# Patient Record
Sex: Male | Born: 1980 | Hispanic: Yes | Marital: Married | Smoking: Never smoker
Health system: Southern US, Community
[De-identification: ages and names within clinical notes are randomized; demographics above are authoritative.]

## PROBLEM LIST (undated history)

## (undated) DIAGNOSIS — M1990 Unspecified osteoarthritis, unspecified site: Secondary | ICD-10-CM

## (undated) HISTORY — PX: JOINT REPLACEMENT: SHX530

## (undated) HISTORY — PX: HERNIA REPAIR: SHX51

---

## 2013-04-20 ENCOUNTER — Emergency Department: Payer: Self-pay | Admitting: Emergency Medicine

## 2013-04-20 LAB — COMPREHENSIVE METABOLIC PANEL
ALBUMIN: 4.4 g/dL (ref 3.4–5.0)
ALK PHOS: 90 U/L
ANION GAP: 4 — AB (ref 7–16)
AST: 23 U/L (ref 15–37)
BUN: 14 mg/dL (ref 7–18)
Bilirubin,Total: 0.4 mg/dL (ref 0.2–1.0)
Calcium, Total: 8.8 mg/dL (ref 8.5–10.1)
Chloride: 104 mmol/L (ref 98–107)
Co2: 28 mmol/L (ref 21–32)
Creatinine: 0.96 mg/dL (ref 0.60–1.30)
EGFR (African American): >60
EGFR (Non-African Amer.): >60
GLUCOSE: 100 mg/dL — AB (ref 65–99)
OSMOLALITY: 273 (ref 275–301)
Potassium: 3.8 mmol/L (ref 3.5–5.1)
SGPT (ALT): 25 U/L (ref 12–78)
Sodium: 136 mmol/L (ref 136–145)
Total Protein: 8.6 g/dL — ABNORMAL HIGH (ref 6.4–8.2)

## 2013-04-20 LAB — LIPASE, BLOOD: Lipase: 94 U/L (ref 73–393)

## 2013-04-20 LAB — CBC
HCT: 44.1 % (ref 40.0–52.0)
HGB: 15.3 g/dL (ref 13.0–18.0)
MCH: 30.4 pg (ref 26.0–34.0)
MCHC: 34.7 g/dL (ref 32.0–36.0)
MCV: 88 fL (ref 80–100)
Platelet: 259 10*3/uL (ref 150–440)
RBC: 5.04 10*6/uL (ref 4.40–5.90)
RDW: 12.9 % (ref 11.5–14.5)
WBC: 6.9 10*3/uL (ref 3.8–10.6)

## 2013-04-20 LAB — URINALYSIS, COMPLETE
Bacteria: NONE SEEN
Bilirubin,UR: NEGATIVE
Blood: NEGATIVE
GLUCOSE, UR: NEGATIVE mg/dL (ref 0–75)
KETONE: NEGATIVE
Leukocyte Esterase: NEGATIVE
Nitrite: NEGATIVE
Ph: 5 (ref 4.5–8.0)
Protein: NEGATIVE
RBC,UR: NONE SEEN /HPF (ref 0–5)
SPECIFIC GRAVITY: 1.027 (ref 1.003–1.030)
SQUAMOUS EPITHELIAL: NONE SEEN
WBC UR: <1 /HPF (ref 0–5)

## 2013-04-20 LAB — RAPID INFLUENZA A&B ANTIGENS (ARMC ONLY)

## 2013-04-23 LAB — BETA STREP CULTURE(ARMC)

## 2014-10-27 ENCOUNTER — Emergency Department
Admission: EM | Admit: 2014-10-27 | Discharge: 2014-10-27 | Disposition: A | Discharge status: Home / Self Care | Payer: Self-pay | Attending: Emergency Medicine | Admitting: Emergency Medicine

## 2014-10-27 ENCOUNTER — Emergency Department: Payer: Self-pay

## 2014-10-27 ENCOUNTER — Encounter: Payer: Self-pay | Admitting: Emergency Medicine

## 2014-10-27 DIAGNOSIS — N201 Calculus of ureter: Secondary | ICD-10-CM | POA: Insufficient documentation

## 2014-10-27 LAB — CBC WITH DIFFERENTIAL/PLATELET
BASOS ABS: 0 10*3/uL (ref 0–0.1)
Basophils Relative: 1 %
EOS ABS: 0 10*3/uL (ref 0–0.7)
EOS PCT: 1 %
HCT: 47.5 % (ref 40.0–52.0)
HEMOGLOBIN: 16.2 g/dL (ref 13.0–18.0)
Lymphocytes Relative: 41 %
Lymphs Abs: 3.2 10*3/uL (ref 1.0–3.6)
MCH: 29.2 pg (ref 26.0–34.0)
MCHC: 34.2 g/dL (ref 32.0–36.0)
MCV: 85.2 fL (ref 80.0–100.0)
Monocytes Absolute: 0.5 10*3/uL (ref 0.2–1.0)
Monocytes Relative: 6 %
NEUTROS PCT: 51 %
Neutro Abs: 4.1 10*3/uL (ref 1.4–6.5)
PLATELETS: 366 10*3/uL (ref 150–440)
RBC: 5.57 MIL/uL (ref 4.40–5.90)
RDW: 13.2 % (ref 11.5–14.5)
WBC: 7.9 10*3/uL (ref 3.8–10.6)

## 2014-10-27 LAB — COMPREHENSIVE METABOLIC PANEL
ALBUMIN: 5.2 g/dL — AB (ref 3.5–5.0)
ALT: 23 U/L (ref 17–63)
AST: 28 U/L (ref 15–41)
Alkaline Phosphatase: 70 U/L (ref 38–126)
Anion gap: 11 (ref 5–15)
BUN: 16 mg/dL (ref 6–20)
CHLORIDE: 103 mmol/L (ref 101–111)
CO2: 24 mmol/L (ref 22–32)
CREATININE: 1.12 mg/dL (ref 0.61–1.24)
Calcium: 10.1 mg/dL (ref 8.9–10.3)
GFR calc Af Amer: >60 mL/min (ref >60)
GFR calc non Af Amer: >60 mL/min (ref >60)
GLUCOSE: 114 mg/dL — AB (ref 65–99)
Potassium: 3.4 mmol/L — ABNORMAL LOW (ref 3.5–5.1)
SODIUM: 138 mmol/L (ref 135–145)
Total Bilirubin: 1 mg/dL (ref 0.3–1.2)
Total Protein: 8.7 g/dL — ABNORMAL HIGH (ref 6.5–8.1)

## 2014-10-27 LAB — URINALYSIS COMPLETE WITH MICROSCOPIC (ARMC ONLY)
BACTERIA UA: NONE SEEN
BILIRUBIN URINE: NEGATIVE
Glucose, UA: NEGATIVE mg/dL
Ketones, ur: NEGATIVE mg/dL
Leukocytes, UA: NEGATIVE
Nitrite: NEGATIVE
PH: 7 (ref 5.0–8.0)
Protein, ur: NEGATIVE mg/dL
Specific Gravity, Urine: 1.018 (ref 1.005–1.030)

## 2014-10-27 LAB — LIPASE, BLOOD: Lipase: 22 U/L (ref 22–51)

## 2014-10-27 MED ORDER — TAMSULOSIN HCL 0.4 MG PO CAPS: 0.4000 mg | ORAL_CAPSULE | Freq: Every day | ORAL | Status: AC

## 2014-10-27 MED ORDER — ONDANSETRON HCL 4 MG/2ML IJ SOLN
4.0000 mg | Freq: Once | INTRAMUSCULAR | Status: AC
  Administered 2014-10-27: 4 mg via INTRAVENOUS

## 2014-10-27 MED ORDER — HYDROMORPHONE HCL 1 MG/ML IJ SOLN
1.0000 mg | Freq: Once | INTRAMUSCULAR | Status: AC
  Administered 2014-10-27: 1 mg via INTRAVENOUS

## 2014-10-27 MED ORDER — KETOROLAC TROMETHAMINE 30 MG/ML IJ SOLN
30.0000 mg | Freq: Once | INTRAMUSCULAR | Status: AC
  Administered 2014-10-27: 30 mg via INTRAVENOUS
  Filled 2014-10-27: qty 1

## 2014-10-27 MED ORDER — OXYCODONE-ACETAMINOPHEN 5-325 MG PO TABS: 1.0000 | ORAL_TABLET | Freq: Four times a day (QID) | ORAL | Status: DC | PRN

## 2014-10-27 NOTE — Discharge Instructions (Signed)
Cálculos renales °(Kidney Stones) °Los cálculos renales (urolitiasis) son masas sólidas que se forman en el interior de los riñones. El dolor intenso es causado por el movimiento de la piedra a través del riñón, uréter, vejiga y uretra (tracto urinario). Cuando la piedra se mueve, el uréter hace un espasmo alrededor de la misma. El cálculo generalmente se elimina con el pis (la orina).  °CUIDADOS EN EL HOGAR °· Debe ingerir gran cantidad de líquido para mantener la orina de tono claro o color amarillo pálido. Esto ayudará a eliminar la piedra. °· Cuele la orina con el colador que le han provisto. Noorine de otra forma que no sea a través del colador, ni siquiera una vez. Si elimina la piedra, se retendrá en el colador. Puede ser tan pequeña como un grano de sal. Llévela a su visita con el médico. Esto ayudará a que el médico le indique qué puede hacer para tratar de prevenir la ocurrencia de nuevos cálculos renales. °· Sólo tome los medicamentos que le haya indicado su médico. °· Concurra a las consultas de control con el médico, según las indicaciones. °· Hágase las radiografías según las indicaciones de su médico. °SOLICITE AYUDA SI: °Siente un dolor que empeora aún tomando analgésicos. °SOLICITE AYUDA DE INMEDIATO SI:  °· El dolor no mejora con los medicamentos recetados. °· Siente escalofríos o fiebre. °· El dolor aumenta y empeora en las siguientes 18 horas. °· Siente un nuevo dolor en el vientre (abdominal). °· Sufre mareos o se desmaya. °· Nota que no puede orinar. °ASEGÚRESE DE QUE:  °· Comprende estas instrucciones. °· Controlará su afección. °· Recibirá ayuda de inmediato si no mejora o si empeora. °Document Released: 04/18/2008 Document Revised: 09/22/2012 °ExitCare® Patient Information ©2015 [employer]. This information is not intended to replace advice given to you by your health care provider. Make sure you discuss any questions you have with your health care provider. ° °

## 2014-10-27 NOTE — ED Notes (Signed)
Reports LLQ pain onset today

## 2014-10-27 NOTE — ED Notes (Addendum)
Dr. Lenard Lance is at bedside with Spanish interpreter and myself at this time.  MD gave CT scan results of presence of kidney stone and educated patient and spouse regarding kidney stones and discharge plan.  Pt and spouse verbalize understanding.

## 2014-10-27 NOTE — ED Provider Notes (Signed)
[facility] Emergency Department Provider Note  Time seen: 11:20 AM  I have reviewed the triage vital signs and the nursing notes.   HISTORY  Chief Complaint Abdominal Pain    HPI Kyle Reynolds is a 34 y.o. male who presents to the emergency department with left flank pain. According to the patient he had a left hernia repaired in February, he also notes a recent accident at work which he suffered abdominal pain from back in June currently he has to wear an abdominal/back brace and a soft neck collar since his accident. Patient states he is on pain medication, but his pain has been tolerable until this morning he had acute onset 2 hours prior to arrival of left flank pain. Patient states 10/10 severe sharp left flank pain radiating down to the left testicle.States nausea but denies vomiting. Denies diarrhea or constipation last bowel movement was this morning which is normal. Denies black or bloody stool. Denies dysuria or hematuria. No history of kidney stones.    History reviewed. No pertinent past medical history.  There are no active problems to display for this patient.   History reviewed. No pertinent past surgical history.  No current outpatient prescriptions on file.  Allergies Review of patient's allergies indicates no known allergies.  History reviewed. No pertinent family history.  Social History Social History  Substance Use Topics  . Smoking status: Never Smoker   . Smokeless tobacco: None  . Alcohol Use: No    Review of Systems Constitutional: Negative for fever. Cardiovascular: Negative for chest pain. Respiratory: Negative for shortness of breath. Gastrointestinal: Left flank pain. Positive for nausea. Negative for vomiting, diarrhea. Genitourinary: Negative for dysuria. Negative hematuria. Musculoskeletal: Left back pain Neurological: Negative for headache 10-point ROS otherwise  negative.  ____________________________________________   PHYSICAL EXAM:  VITAL SIGNS: ED Triage Vitals  Enc Vitals Group     BP 10/27/14 1050 150/79 mmHg     Pulse Rate 10/27/14 1050 88     Resp 10/27/14 1050 26     Temp 10/27/14 1024 98.8 F (37.1 C)     Temp Source 10/27/14 1024 Oral     SpO2 10/27/14 1050 98 %     Weight --      Height 10/27/14 1024  (1.676 m)     Head Cir --      Peak Flow --      Pain Score 10/27/14 1024 10     Pain Loc --      Pain Edu? --      Excl. in GC? --    Constitutional: Alert and oriented. Mild distress due to abdominal pain. Eyes: Normal exam ENT   Mouth/Throat: Mucous membranes are moist. Cardiovascular: Normal rate, regular rhythm. No murmur Respiratory: Normal respiratory effort without tachypnea nor retractions. Breath sounds are clear and equal bilaterally. No wheezes/rales/rhonchi. Gastrointestinal: Soft, mild left lower quadrant tenderness palpation. No rebound or guarding. No distention. No CVA tenderness palpation. Normal external GU exam. No testicular tenderness palpation. No masses or swelling noted. Normal skin color. Musculoskeletal: Nontender with normal range of motion in all extremities. Neurologic:  Normal speech and language. No gross focal neurologic deficits are appreciated.  Psychiatric: Mood and affect are normal. Speech and behavior are normal. Patient exhibits appropriate insight and judgment.  ____________________________________________     RADIOLOGY  CT consistent with left-sided ureterolithiasis  ____________________________________________    INITIAL IMPRESSION / ASSESSMENT AND PLAN / ED COURSE  Pertinent labs & imaging results that  were available during my care of the patient were reviewed by me and considered in my medical decision making (see chart for details).  Patient with acute onset of left flank pain. Pain and location are suggestive of ureteral lithiasis, no history of the same. We  will check labs, CT scan, treat pain and nausea while monitoring closely in the emergency department.  CT consistent with ureteral lithiasis. Labs are largely within normal limits otherwise. We'll discharge on Percocet, Flomax, urology follow-up. I discussed this with the interpreter, patient agreeable to plan. ____________________________________________   FINAL CLINICAL IMPRESSION(S) / ED DIAGNOSES  Left flank pain Ureteral lithiasis  Minna Antis, MD 10/27/14 1345

## 2016-12-01 ENCOUNTER — Emergency Department: Payer: Self-pay

## 2016-12-01 ENCOUNTER — Encounter: Payer: Self-pay | Admitting: *Deleted

## 2016-12-01 ENCOUNTER — Emergency Department
Admission: EM | Admit: 2016-12-01 | Discharge: 2016-12-01 | Disposition: A | Discharge status: Home / Self Care | Payer: Self-pay | Attending: Emergency Medicine | Admitting: Emergency Medicine

## 2016-12-01 DIAGNOSIS — Z79899 Other long term (current) drug therapy: Secondary | ICD-10-CM | POA: Insufficient documentation

## 2016-12-01 DIAGNOSIS — M47816 Spondylosis without myelopathy or radiculopathy, lumbar region: Secondary | ICD-10-CM

## 2016-12-01 MED ORDER — MELOXICAM 15 MG PO TABS: 15.0000 mg | ORAL_TABLET | Freq: Every day | ORAL | 2 refills | Status: DC

## 2016-12-01 MED ORDER — CYCLOBENZAPRINE HCL 10 MG PO TABS: 10.0000 mg | ORAL_TABLET | Freq: Three times a day (TID) | ORAL | 0 refills | Status: DC | PRN

## 2016-12-01 MED ORDER — TRAMADOL HCL 50 MG PO TABS: 50.0000 mg | ORAL_TABLET | Freq: Four times a day (QID) | ORAL | 0 refills | Status: DC | PRN

## 2016-12-01 MED ORDER — ORPHENADRINE CITRATE 30 MG/ML IJ SOLN
60.0000 mg | Freq: Two times a day (BID) | INTRAMUSCULAR | Status: DC
  Administered 2016-12-01: 60 mg via INTRAMUSCULAR
  Filled 2016-12-01: qty 2

## 2016-12-01 MED ORDER — KETOROLAC TROMETHAMINE 60 MG/2ML IM SOLN
60.0000 mg | Freq: Once | INTRAMUSCULAR | Status: AC
  Administered 2016-12-01: 60 mg via INTRAMUSCULAR
  Filled 2016-12-01: qty 2

## 2016-12-01 NOTE — ED Triage Notes (Signed)
Pt arrives with complaints of right back pain, increasing with movement, states hx of back injury several months ago and getting injections, denies any urinary symptoms, interpreter used

## 2016-12-01 NOTE — ED Provider Notes (Signed)
[facility] Emergency Department Provider Note   ____________________________________________   First MD Initiated Contact with Patient 12/01/16 1222     (approximate)  I have reviewed the triage vital signs and the nursing notes.   HISTORY  Chief Complaint Back Pain    HPI Kyle Reynolds is a 36 y.o. male patient complaining of mid back pain which increases with movements 1 month. Patient state he has had several months of back pain secondary to a worker's comp injury that got better with injections. Onset of this pain was in the lower part of the back. Patient stated the patient upon his back is no longer of a concern. Patient denies radicular component to his back pain. Patient has bilateral bowel dysfunction. Review of x-ray showed the patient have degenerative changes in the lower lumbar spine with disc space narrowing.History reviewed. No pertinent past medical history.  There are no active problems to display for this patient.   History reviewed. No pertinent surgical history.  Prior to Admission medications   Medication Sig Start Date End Date Taking? Authorizing Provider  cyclobenzaprine (FLEXERIL) 10 MG tablet Take 1 tablet (10 mg total) by mouth 3 (three) times daily as needed. 12/01/16   Joni Reining, PA-C  meloxicam (MOBIC) 15 MG tablet Take 1 tablet (15 mg total) by mouth daily. 12/01/16   Joni Reining, PA-C  oxyCODONE-acetaminophen (ROXICET) 5-325 MG per tablet Take 1 tablet by mouth every 6 (six) hours as needed. 10/27/14   Minna Antis, MD  tamsulosin (FLOMAX) 0.4 MG CAPS capsule Take 1 capsule (0.4 mg total) by mouth daily. 10/27/14   Minna Antis, MD  traMADol (ULTRAM) 50 MG tablet Take 1 tablet (50 mg total) by mouth every 6 (six) hours as needed for moderate pain. 12/01/16   Joni Reining, PA-C    Allergies Patient has no known allergies.  History reviewed. No pertinent family history.  Social  History Social History  Substance Use Topics  . Smoking status: Never Smoker  . Smokeless tobacco: Not on file  . Alcohol use No    Review of Systems  Constitutional: No fever/chills Eyes: No visual changes. ENT: No sore throat. Cardiovascular: Denies chest pain. Respiratory: Denies shortness of breath. Gastrointestinal: No abdominal pain.  No nausea, no vomiting.  No diarrhea.  No constipation. Genitourinary: Negative for dysuria. Musculoskeletal: Positive chronic back pain  Skin: Negative for rash. Neurological: Negative for headaches, focal weakness or numbness.   ____________________________________________   PHYSICAL EXAM:  VITAL SIGNS: ED Triage Vitals [12/01/16 1157]  Enc Vitals Group     BP 134/73     Pulse Rate 82     Resp 18     Temp 98.6 F (37 C)     Temp Source Oral     SpO2 100 %     Weight 210 lb (95.3 kg)     Height 5\' 10"  (1.778 m)     Head Circumference      Peak Flow      Pain Score 8     Pain Loc      Pain Edu?      Excl. in GC?    Constitutional: Alert and oriented. Well appearing and in no acute distress. Cardiovascular: Normal rate, regular rhythm. Grossly normal heart sounds.  Good peripheral circulation. Respiratory: Normal respiratory effort.  No retractions. Lungs CTAB. Gastrointestinal: Soft and nontender. No distention. No abdominal bruits. No CVA tenderness. Musculoskeletal: No evidence lumbar spinal deformity. Patient is moderate  guarding palpation L1-L4. Patient has decreased range of motion with flexion. Patient has negative straight leg test. No lower extremity tenderness nor edema.  No joint effusions. Neurologic:  Normal speech and language. No gross focal neurologic deficits are appreciated. No gait instability. Skin:  Skin is warm, dry and intact. No rash noted. Psychiatric: Mood and affect are normal. Speech and behavior are normal.  ____________________________________________   LABS (all labs ordered are listed, but  only abnormal results are displayed)  Labs Reviewed - No data to display ____________________________________________  EKG   ____________________________________________  RADIOLOGY  Dg Lumbar Spine Complete  Result Date: 12/01/2016 CLINICAL DATA:  Right back pain increasing with movement. History of back injury several months ago. EXAM: LUMBAR SPINE - COMPLETE 4+ VIEW COMPARISON:  None. FINDINGS: Five non ribbed lumbar vertebrae. No acute fracture or suspicious osseous lesions. Minimal grade 1 anterolisthesis of L3 on L4 with spondylolysis of the L3 pars interarticularis. Moderate disc space narrowing L5-S1. Lower lumbar facet arthropathy with hypertrophy at L4-5 and L5-S1. No acute osseous abnormality. Well corticated ossification off the L2 spinous process may reflect an old remote injury. IMPRESSION: 1. Moderate L5-S1 disc space narrowing with L4-5 and L5-S1 facet arthropathy. 2. 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 Electronically Signed   By: Tollie Ethavid  Kwon M.D.   On: 12/01/2016 14:07    ____________________________________________   PROCEDURES  Procedure(s) performed: None  Procedures  Critical Care performed: No  ____________________________________________   INITIAL IMPRESSION / ASSESSMENT AND PLAN / ED COURSE  As part of my medical decision making, I reviewed the following data within the electronic MEDICAL RECORD NUMBER    Chronic back pain. Discussed x-ray findings with patient. Patient given discharge Instructions. [facility]. Patient given a work note and advised take medication as directed.      ____________________________________________   FINAL CLINICAL IMPRESSION(S) / ED DIAGNOSES  Final diagnoses:  Spondylosis of lumbar region without myelopathy or radiculopathy      NEW MEDICATIONS STARTED DURING  THIS VISIT:  New Prescriptions   CYCLOBENZAPRINE (FLEXERIL) 10 MG TABLET    Take 1 tablet (10 mg total) by mouth 3 (three) times daily as needed.   MELOXICAM (MOBIC) 15 MG TABLET    Take 1 tablet (15 mg total) by mouth daily.   TRAMADOL (ULTRAM) 50 MG TABLET    Take 1 tablet (50 mg total) by mouth every 6 (six) hours as needed for moderate pain.     Note:  This document was prepared using Dragon voice recognition software and may include unintentional dictation errors.    Joni ReiningSmith, Keyli Duross K, PA-C 12/01/16 1510    Governor RooksLord, Rebecca, MD 12/01/16 [phone]

## 2016-12-01 NOTE — ED Notes (Signed)
PA Ron and spanish interpreter at bedside.

## 2018-02-05 ENCOUNTER — Emergency Department: Payer: Self-pay

## 2018-02-05 ENCOUNTER — Other Ambulatory Visit: Payer: Self-pay

## 2018-02-05 DIAGNOSIS — R05 Cough: Secondary | ICD-10-CM | POA: Insufficient documentation

## 2018-02-05 DIAGNOSIS — R079 Chest pain, unspecified: Secondary | ICD-10-CM | POA: Insufficient documentation

## 2018-02-05 LAB — CBC
HCT: 44.3 % (ref 39.0–52.0)
HEMOGLOBIN: 14.7 g/dL (ref 13.0–17.0)
MCH: 29.2 pg (ref 26.0–34.0)
MCHC: 33.2 g/dL (ref 30.0–36.0)
MCV: 88.1 fL (ref 80.0–100.0)
PLATELETS: 317 10*3/uL (ref 150–400)
RBC: 5.03 MIL/uL (ref 4.22–5.81)
RDW: 11.9 % (ref 11.5–15.5)
WBC: 7.6 10*3/uL (ref 4.0–10.5)
nRBC: 0 % (ref 0.0–0.2)

## 2018-02-05 LAB — COMPREHENSIVE METABOLIC PANEL
ALK PHOS: 80 U/L (ref 38–126)
ALT: 20 U/L (ref 0–44)
ANION GAP: 7 (ref 5–15)
AST: 22 U/L (ref 15–41)
Albumin: 4.3 g/dL (ref 3.5–5.0)
BUN: 19 mg/dL (ref 6–20)
CALCIUM: 9.2 mg/dL (ref 8.9–10.3)
CO2: 25 mmol/L (ref 22–32)
CREATININE: 1.16 mg/dL (ref 0.61–1.24)
Chloride: 104 mmol/L (ref 98–111)
Glucose, Bld: 116 mg/dL — ABNORMAL HIGH (ref 70–99)
Potassium: 3.7 mmol/L (ref 3.5–5.1)
SODIUM: 136 mmol/L (ref 135–145)
Total Bilirubin: 0.5 mg/dL (ref 0.3–1.2)
Total Protein: 7.4 g/dL (ref 6.5–8.1)

## 2018-02-05 LAB — TROPONIN I

## 2018-02-05 NOTE — ED Triage Notes (Signed)
Pt in with co epigastric pain and reflux, states has also had a cough. States due to forceful cough has had some back pain.

## 2018-02-06 ENCOUNTER — Emergency Department
Admission: EM | Admit: 2018-02-06 | Discharge: 2018-02-06 | Disposition: A | Discharge status: Home / Self Care | Payer: Self-pay | Attending: Emergency Medicine | Admitting: Emergency Medicine

## 2018-02-06 DIAGNOSIS — R059 Cough, unspecified: Secondary | ICD-10-CM

## 2018-02-06 DIAGNOSIS — R05 Cough: Secondary | ICD-10-CM

## 2018-02-06 MED ORDER — HYDROCODONE-HOMATROPINE 5-1.5 MG/5ML PO SYRP: 5.0000 mL | ORAL_SOLUTION | Freq: Four times a day (QID) | ORAL | 0 refills | Status: DC | PRN

## 2018-02-06 MED ORDER — BENZONATATE 100 MG PO CAPS: 100.0000 mg | ORAL_CAPSULE | Freq: Three times a day (TID) | ORAL | 0 refills | Status: DC | PRN

## 2018-02-06 NOTE — ED Notes (Signed)
Family to stat desk asking about wait time. Family given update on wait time. Family verbalizes understanding.  

## 2018-02-06 NOTE — ED Notes (Signed)
No peripheral IV placed this visit.   Discharge instructions reviewed with patient. Questions fielded by this RN. Patient verbalizes understanding of instructions. Patient discharged home in stable condition per forbach. No acute distress noted at time of discharge.   

## 2018-02-06 NOTE — ED Provider Notes (Signed)
[facility] Emergency Department Provider Note  ____________________________________________   First MD Initiated Contact with Patient 02/06/18 [phone]     (approximate)  I have reviewed the triage vital signs and the nursing notes.   HISTORY  Chief Complaint Back Pain and Chest Pain  The patient and/or family speak(s) Spanish.  They understand they have the right to the use of a hospital interpreter, however at this time they prefer to speak directly with me in Spanish.  They know that they can ask for an interpreter at any time.   HPI Kyle Reynolds is a 38 y.o. male who denies any chronic medical history and presents by private vehicle for evaluation of a variety of complaints.  Most notably he reports that about a month ago he had a cold and developed a cough, nasal congestion, runny nose, and generally feeling bad.  All the symptoms except for the cough have completely resolved and the cough is been persistent and what he describes as moderate to severe over the last month.  It has not gotten worse but it also has not improved.  Nothing in particular makes it better and exertion might make it a little bit worse.  Today he was concerned because he was having some pain radiating around the right side when he coughs but also when he was not coughing.  He felt like he has some acid reflux and those symptoms have resolved but he thought he should get it checked out.  He denies fever/chills, chest pain, nausea, vomiting, abdominal pain, and dysuria.  No past medical history on file.  There are no active problems to display for this patient.   No past surgical history on file.  Prior to Admission medications   Medication Sig Start Date End Date Taking? Authorizing Provider  benzonatate (TESSALON PERLES) 100 MG capsule Take 1 capsule (100 mg total) by mouth 3 (three) times daily as needed for cough. 02/06/18   Loleta Rose, MD  cyclobenzaprine (FLEXERIL)  10 MG tablet Take 1 tablet (10 mg total) by mouth 3 (three) times daily as needed. 12/01/16   Joni Reining, PA-C  HYDROcodone-homatropine [facility]) 5-1.5 MG/5ML syrup Take 5 mLs by mouth every 6 (six) hours as needed for cough. 02/06/18   Loleta Rose, MD  meloxicam (MOBIC) 15 MG tablet Take 1 tablet (15 mg total) by mouth daily. 12/01/16   Joni Reining, PA-C  oxyCODONE-acetaminophen (ROXICET) 5-325 MG per tablet Take 1 tablet by mouth every 6 (six) hours as needed. 10/27/14   Minna Antis, MD  tamsulosin (FLOMAX) 0.4 MG CAPS capsule Take 1 capsule (0.4 mg total) by mouth daily. 10/27/14   Minna Antis, MD  traMADol (ULTRAM) 50 MG tablet Take 1 tablet (50 mg total) by mouth every 6 (six) hours as needed for moderate pain. 12/01/16   Joni Reining, PA-C    Allergies Patient has no known allergies.  No family history on file.  Social History Social History   Tobacco Use  . Smoking status: Never Smoker  Substance Use Topics  . Alcohol use: No  . Drug use: Not on file    Review of Systems Constitutional: No fever/chills Eyes: No visual changes. ENT: No sore throat. Cardiovascular: Chest pain radiating around his right side when he coughed but also occasionally when he was just moving around. Respiratory: Persistent cough for about a month. Gastrointestinal: No abdominal pain.  No nausea, no vomiting.  No diarrhea.  No constipation. Genitourinary: Negative for dysuria.  Musculoskeletal: Negative for neck pain.  Negative for back pain. Integumentary: Negative for rash. Neurological: Negative for headaches, focal weakness or numbness.   ____________________________________________   PHYSICAL EXAM:  VITAL SIGNS: ED Triage Vitals  Enc Vitals Group     BP 02/05/18 2151 (!) 149/85     Pulse Rate 02/05/18 2151 93     Resp 02/05/18 2151 20     Temp 02/05/18 2151 98.6 F (37 C)     Temp Source 02/05/18 2151 Oral     SpO2 02/05/18 2151 97 %     Weight 02/05/18 2152  99.8 kg (220 lb)     Height --      Head Circumference --      Peak Flow --      Pain Score 02/05/18 2152 5     Pain Loc --      Pain Edu? --      Excl. in GC? --     Constitutional: Alert and oriented. Well appearing and in no acute distress. Eyes: Conjunctivae are normal.  Head: Atraumatic. Nose: No congestion/rhinnorhea. Mouth/Throat: Mucous membranes are moist. Neck: No stridor.  No meningeal signs.   Cardiovascular: Normal rate, regular rhythm. Good peripheral circulation. Grossly normal heart sounds. Respiratory: Normal respiratory effort.  No retractions. Lungs CTAB. Gastrointestinal: Soft and nontender. No distention.  Musculoskeletal: No lower extremity tenderness nor edema. No gross deformities of extremities.  No reproducible chest wall tenderness. Neurologic:  Normal speech and language. No gross focal neurologic deficits are appreciated.  Skin:  Skin is warm, dry and intact. No rash noted. Psychiatric: Mood and affect are normal. Speech and behavior are normal.  ____________________________________________   LABS (all labs ordered are listed, but only abnormal results are displayed)  Labs Reviewed  COMPREHENSIVE METABOLIC PANEL - Abnormal; Notable for the following components:      Result Value   Glucose, Bld 116 (*)    All other components within normal limits  CBC  TROPONIN I   ____________________________________________  EKG  ED ECG REPORT I, Loleta Rose, the attending physician, personally viewed and interpreted this ECG.  Date: 02/05/2018 EKG Time: 21: 53 Rate: 79 Rhythm: normal sinus rhythm with sinus arrhythmia QRS Axis: normal Intervals: normal ST/T Wave abnormalities: No acute abnormalities identified on EKG Narrative Interpretation: no evidence of acute ischemia   ____________________________________________  RADIOLOGY I, Loleta Rose, personally viewed and evaluated these images (plain radiographs) as part of my medical decision  making, as well as reviewing the written report by the radiologist.  ED MD interpretation: No indication of acute intrathoracic abnormality  Official radiology report(s): Dg Chest 2 View  Result Date: 02/05/2018 CLINICAL DATA:  Chest pain EXAM: CHEST - 2 VIEW COMPARISON:  04/20/2013 FINDINGS: The heart size and mediastinal contours are within normal limits. Both lungs are clear. The visualized skeletal structures are unremarkable. IMPRESSION: No active cardiopulmonary disease. Electronically Signed   By: Jasmine Pang M.D.   On: 02/05/2018 22:35    ____________________________________________   PROCEDURES  Critical Care performed: No   Procedure(s) performed:   Procedures   ____________________________________________   INITIAL IMPRESSION / ASSESSMENT AND PLAN / ED COURSE  As part of my medical decision making, I reviewed the following data within the electronic MEDICAL RECORD NUMBER Nursing notes reviewed and incorporated, Labs reviewed , EKG interpreted , Old chart reviewed, Radiograph reviewed  and Notes from prior ED visits and reviewed [place] controlled substance database    Differential diagnosis includes, but is not limited to,  viral illness, musculoskeletal pain, ACS, PE, pneumonia.  The patient appears to be in no discomfort and is well-appearing, young, and generally healthy.  His EKG and chest x-ray are normal.  His lab results are all reassuring as well.  I talked to him about persistent cough/bronchitis after viral illness and the fact he is no signs of pneumonia at this time.  He is low risk for ACS and PERC negative.  I am giving him antitussives as listed below and encouraged outpatient follow-up.  He understands and agrees with the plan.     ____________________________________________  FINAL CLINICAL IMPRESSION(S) / ED DIAGNOSES  Final diagnoses:  Cough     MEDICATIONS GIVEN DURING THIS VISIT:  Medications - No data to display   ED Discharge  Orders         Ordered    benzonatate (TESSALON PERLES) 100 MG capsule  3 times daily PRN     02/06/18 0452    HYDROcodone-homatropine (HYCODAN) 5-1.5 MG/5ML syrup  Every 6 hours PRN     02/06/18 0452           Note:  This document was prepared using Dragon voice recognition software and may include unintentional dictation errors.    Loleta RoseForbach, Sewell Pitner, MD 02/06/18 [phone]

## 2018-07-22 ENCOUNTER — Other Ambulatory Visit: Payer: Self-pay

## 2018-07-22 DIAGNOSIS — K5730 Diverticulosis of large intestine without perforation or abscess without bleeding: Secondary | ICD-10-CM | POA: Diagnosis not present

## 2018-07-22 DIAGNOSIS — R112 Nausea with vomiting, unspecified: Secondary | ICD-10-CM | POA: Insufficient documentation

## 2018-07-22 DIAGNOSIS — Z79899 Other long term (current) drug therapy: Secondary | ICD-10-CM | POA: Insufficient documentation

## 2018-07-22 DIAGNOSIS — Z20828 Contact with and (suspected) exposure to other viral communicable diseases: Secondary | ICD-10-CM | POA: Diagnosis not present

## 2018-07-22 LAB — CBC
HCT: 45.9 % (ref 39.0–52.0)
Hemoglobin: 15.7 g/dL (ref 13.0–17.0)
MCH: 29.6 pg (ref 26.0–34.0)
MCHC: 34.2 g/dL (ref 30.0–36.0)
MCV: 86.6 fL (ref 80.0–100.0)
Platelets: 361 10*3/uL (ref 150–400)
RBC: 5.3 MIL/uL (ref 4.22–5.81)
RDW: 12.6 % (ref 11.5–15.5)
WBC: 9.8 10*3/uL (ref 4.0–10.5)
nRBC: 0 % (ref 0.0–0.2)

## 2018-07-22 LAB — COMPREHENSIVE METABOLIC PANEL WITH GFR
ALT: 28 U/L (ref 0–44)
AST: 23 U/L (ref 15–41)
Albumin: 4.7 g/dL (ref 3.5–5.0)
Alkaline Phosphatase: 78 U/L (ref 38–126)
Anion gap: 10 (ref 5–15)
BUN: 17 mg/dL (ref 6–20)
CO2: 25 mmol/L (ref 22–32)
Calcium: 9.5 mg/dL (ref 8.9–10.3)
Chloride: 105 mmol/L (ref 98–111)
Creatinine, Ser: 1.04 mg/dL (ref 0.61–1.24)
GFR calc Af Amer: >60 mL/min
GFR calc non Af Amer: >60 mL/min
Glucose, Bld: 102 mg/dL — ABNORMAL HIGH (ref 70–99)
Potassium: 3.9 mmol/L (ref 3.5–5.1)
Sodium: 140 mmol/L (ref 135–145)
Total Bilirubin: 0.8 mg/dL (ref 0.3–1.2)
Total Protein: 8.1 g/dL (ref 6.5–8.1)

## 2018-07-22 LAB — LIPASE, BLOOD: Lipase: 27 U/L (ref 11–51)

## 2018-07-22 NOTE — ED Triage Notes (Addendum)
Patient c/o fever, chills, nausea, emesis, headache, and low back pain beginning yesterday.   Patient has been around someone diagnosed with COVID.

## 2018-07-23 ENCOUNTER — Emergency Department
Admission: EM | Admit: 2018-07-23 | Discharge: 2018-07-23 | Disposition: A | Discharge status: Home / Self Care | Payer: HRSA Program | Attending: Emergency Medicine | Admitting: Emergency Medicine

## 2018-07-23 ENCOUNTER — Emergency Department: Payer: HRSA Program

## 2018-07-23 DIAGNOSIS — R112 Nausea with vomiting, unspecified: Secondary | ICD-10-CM

## 2018-07-23 DIAGNOSIS — K5730 Diverticulosis of large intestine without perforation or abscess without bleeding: Secondary | ICD-10-CM

## 2018-07-23 DIAGNOSIS — R509 Fever, unspecified: Secondary | ICD-10-CM

## 2018-07-23 HISTORY — DX: Unspecified osteoarthritis, unspecified site: M19.90

## 2018-07-23 LAB — SARS CORONAVIRUS 2 BY RT PCR (HOSPITAL ORDER, PERFORMED IN ~~LOC~~ HOSPITAL LAB): SARS Coronavirus 2: NEGATIVE

## 2018-07-23 MED ORDER — IOHEXOL 300 MG/ML  SOLN
100.0000 mL | Freq: Once | INTRAMUSCULAR | Status: AC | PRN
  Administered 2018-07-23: 100 mL via INTRAVENOUS

## 2018-07-23 MED ORDER — ONDANSETRON 4 MG PO TBDP: 4.0000 mg | ORAL_TABLET | Freq: Three times a day (TID) | ORAL | 0 refills | Status: DC | PRN

## 2018-07-23 NOTE — ED Provider Notes (Signed)
[facility] Emergency Department Provider Note   First MD Initiated Contact with Patient 07/23/18 0030     (approximate)  I have reviewed the triage vital signs and the nursing notes.   HISTORY  Chief Complaint Emesis and Fever   HPI Kyle Reynolds is a 38 y.o. male presents to the emergency department secondary to fever chills nausea vomiting headache and low back discomfort which patient stated started yesterday.  Patient also admits to sick contact who tested positive for COVID-19.        Past Medical History:  Diagnosis Date   Arthritis     There are no active problems to display for this patient.   Past Surgical History:  Procedure Laterality Date   HERNIA REPAIR     JOINT REPLACEMENT     shoulder    Prior to Admission medications   Medication Sig Start Date End Date Taking? Authorizing Provider  benzonatate (TESSALON PERLES) 100 MG capsule Take 1 capsule (100 mg total) by mouth 3 (three) times daily as needed for cough. 02/06/18   Loleta RoseForbach, Cory, MD  cyclobenzaprine (FLEXERIL) 10 MG tablet Take 1 tablet (10 mg total) by mouth 3 (three) times daily as needed. 12/01/16   Joni ReiningSmith, Ronald K, PA-C  HYDROcodone-homatropine [facility](HYCODAN) 5-1.5 MG/5ML syrup Take 5 mLs by mouth every 6 (six) hours as needed for cough. 02/06/18   Loleta RoseForbach, Cory, MD  meloxicam (MOBIC) 15 MG tablet Take 1 tablet (15 mg total) by mouth daily. 12/01/16   Joni ReiningSmith, Ronald K, PA-C  ondansetron (ZOFRAN ODT) 4 MG disintegrating tablet Take 1 tablet (4 mg total) by mouth every 8 (eight) hours as needed. 07/23/18   Darci CurrentBrown, [place] N, MD  oxyCODONE-acetaminophen (ROXICET) 5-325 MG per tablet Take 1 tablet by mouth every 6 (six) hours as needed. 10/27/14   Minna AntisPaduchowski, Kevin, MD  tamsulosin (FLOMAX) 0.4 MG CAPS capsule Take 1 capsule (0.4 mg total) by mouth daily. 10/27/14   Minna AntisPaduchowski, Kevin, MD  traMADol (ULTRAM) 50 MG tablet Take 1 tablet (50 mg total) by mouth every 6 (six)  hours as needed for moderate pain. 12/01/16   Joni ReiningSmith, Ronald K, PA-C    Allergies Patient has no known allergies.  No family history on file.  Social History Social History   Tobacco Use   Smoking status: Never Smoker   Smokeless tobacco: Never Used  Substance Use Topics   Alcohol use: No   Drug use: Not on file    Review of Systems Constitutional: No fever/chills Eyes: No visual changes. ENT: No sore throat. Cardiovascular: Denies chest pain. Respiratory: Denies shortness of breath. Gastrointestinal: No abdominal pain.  No nausea, no vomiting.  No diarrhea.  No constipation. Genitourinary: Negative for dysuria. Musculoskeletal: Negative for neck pain.  Negative for back pain. Integumentary: Negative for rash. Neurological: Negative for headaches, focal weakness or numbness.   ____________________________________________   PHYSICAL EXAM:  VITAL SIGNS: ED Triage Vitals [07/22/18 2125]  Enc Vitals Group     BP (!) 154/89     Pulse Rate 94     Resp 18     Temp 99.2 F (37.3 C)     Temp Source Oral     SpO2 97 %     Weight 95.3 kg (210 lb)     Height 1.702 m (5\' 7" )     Head Circumference      Peak Flow      Pain Score 6     Pain Loc  Pain Edu?      Excl. in [place]?     Constitutional: Alert and oriented. Well appearing and in no acute distress. Eyes: Conjunctivae are normal.  Mouth/Throat: Mucous membranes are moist.  Oropharynx non-erythematous. Neck: No stridor.   Cardiovascular: Normal rate, regular rhythm. Good peripheral circulation. Grossly normal heart sounds. Respiratory: Normal respiratory effort.  No retractions. No audible wheezing. Gastrointestinal: Right upper quadrant/right lower quadrant tenderness to palpation. No distention.  Musculoskeletal: No lower extremity tenderness nor edema. No gross deformities of extremities. Neurologic:  Normal speech and language. No gross focal neurologic deficits are appreciated.  Skin:  Skin is warm,  dry and intact. No rash noted. Psychiatric: Mood and affect are normal. Speech and behavior are normal.  ____________________________________________   LABS (all labs ordered are listed, but only abnormal results are displayed)  Labs Reviewed  COMPREHENSIVE METABOLIC PANEL - Abnormal; Notable for the following components:      Result Value   Glucose, Bld 102 (*)    All other components within normal limits  SARS CORONAVIRUS 2 (HOSPITAL ORDER, [facility])  LIPASE, BLOOD  CBC  URINALYSIS, COMPLETE (UACMP) WITH MICROSCOPIC   ___________________________  RADIOLOGY I, [place], personally viewed and evaluated these images (plain radiographs) as part of my medical decision making, as well as reviewing the written report by the radiologist.  ED MD interpretation: No active disease noted on chest x-ray per radiologist.  Official radiology report(s): Ct Abdomen Pelvis W Contrast  Result Date: 07/23/2018 CLINICAL DATA:  Right upper and right lower quadrant pain. EXAM: CT ABDOMEN AND PELVIS WITH CONTRAST TECHNIQUE: Multidetector CT imaging of the abdomen and pelvis was performed using the standard protocol following bolus administration of intravenous contrast. CONTRAST:  148mL OMNIPAQUE IOHEXOL 300 MG/ML  SOLN COMPARISON:  None. FINDINGS: Lower chest: Minimal compressive atelectasis in the medial right lower lobe adjacent osteophytes. No focal consolidation. Hepatobiliary: No focal liver abnormality is seen. No gallstones, gallbladder wall thickening, or biliary dilatation. Pancreas: No ductal dilatation or inflammation. Spleen: Normal in size without focal abnormality. Adrenals/Urinary Tract: Normal adrenal glands. No hydronephrosis or perinephric edema. Homogeneous renal enhancement. Urinary bladder is physiologically distended without wall thickening. Stomach/Bowel: Stomach is nondistended. No small bowel inflammation, wall thickening, or obstruction. Appendix  tentatively identified and normal, image 67 series 2. No pericecal or right lower quadrant inflammation. Terminal ileum is normal. There a few scattered colonic diverticula without diverticulitis. No colonic wall thickening or inflammatory change. Vascular/Lymphatic: No acute vascular findings. Normal caliber abdominal aorta. Portal vein appears patent. No enlarged lymph nodes in the abdomen or pelvis. Reproductive: Prostate is unremarkable. Other: No free air, free fluid, or intra-abdominal fluid collection. Tiny fat containing umbilical hernia. Musculoskeletal: Grade 1 anterolisthesis of L5 on S1, likely degenerative. Associated degenerative disc disease. There are no acute or suspicious osseous abnormalities. IMPRESSION: 1. No acute abnormality or explanation for abdominal pain. 2. Minimal colonic diverticulosis without diverticulitis. Electronically Signed   By: Keith Rake M.D.   On: 07/23/2018 02:00   Dg Chest Port 1 View  Result Date: 07/23/2018 CLINICAL DATA:  Fever, chills. EXAM: PORTABLE CHEST 1 VIEW COMPARISON:  02/05/2018 FINDINGS: Heart and mediastinal contours are within normal limits. No focal opacities or effusions. No acute bony abnormality. IMPRESSION: No active disease. Electronically Signed   By: Rolm Baptise M.D.   On: 07/23/2018 00:35      Procedures   ____________________________________________   INITIAL IMPRESSION / MDM / ASSESSMENT AND PLAN / ED COURSE  As part of my medical decision making, I reviewed the following data within the electronic MEDICAL RECORD NUMBER 38 year old male presenting with above-stated history and physical exam with concerns for intra-abdominal pathology versus COVID-19.  Laboratory data unremarkable CT scan revealed no acute findings.  Incidentally noted diverticulosis.  *Denzel Garlan FairRamirez Reynolds was evaluated in Emergency Department on 07/23/2018 for the symptoms described in the history of present illness. He was evaluated in the context of  the global COVID-19 pandemic, which necessitated consideration that the patient might be at risk for infection with the SARS-CoV-2 virus that causes COVID-19. Institutional protocols and algorithms that pertain to the evaluation of patients at risk for COVID-19 are in a state of rapid change based on information released by regulatory bodies including the CDC and federal and state organizations. These policies and algorithms were followed during the patient's care in the ED.  Some ED evaluations and interventions may be delayed as a result of limited staffing during the pandemic.*        ____________________________________________  FINAL CLINICAL IMPRESSION(S) / ED DIAGNOSES  Final diagnoses:  Non-intractable vomiting with nausea, unspecified vomiting type  Diverticulosis of colon     MEDICATIONS GIVEN DURING THIS VISIT:  Medications  iohexol (OMNIPAQUE) 300 MG/ML solution 100 mL (100 mLs Intravenous Contrast Given 07/23/18 0125)     ED Discharge Orders         Ordered    ondansetron (ZOFRAN ODT) 4 MG disintegrating tablet  Every 8 hours PRN     07/23/18 0324           Note:  This document was prepared using Dragon voice recognition software and may include unintentional dictation errors.   Darci CurrentBrown, [place] N, MD 07/23/18 [phone]

## 2018-12-21 ENCOUNTER — Other Ambulatory Visit: Payer: Self-pay

## 2018-12-21 ENCOUNTER — Encounter: Payer: Self-pay | Admitting: Emergency Medicine

## 2018-12-21 ENCOUNTER — Emergency Department: Payer: Self-pay

## 2018-12-21 DIAGNOSIS — Z79899 Other long term (current) drug therapy: Secondary | ICD-10-CM | POA: Insufficient documentation

## 2018-12-21 DIAGNOSIS — Z791 Long term (current) use of non-steroidal anti-inflammatories (NSAID): Secondary | ICD-10-CM | POA: Insufficient documentation

## 2018-12-21 DIAGNOSIS — U071 COVID-19: Secondary | ICD-10-CM | POA: Insufficient documentation

## 2018-12-21 NOTE — ED Triage Notes (Addendum)
Pt to triage via w/c, mask in place brought in by EMS from home for c/o cough & fever since last wk with HA, SHOB & chest tightness; st recent exposure to COVID

## 2018-12-22 ENCOUNTER — Emergency Department
Admission: EM | Admit: 2018-12-22 | Discharge: 2018-12-22 | Disposition: A | Discharge status: Home / Self Care | Payer: Self-pay | Attending: Student | Admitting: Student

## 2018-12-22 DIAGNOSIS — Z20822 Contact with and (suspected) exposure to covid-19: Secondary | ICD-10-CM

## 2018-12-22 LAB — CBC WITH DIFFERENTIAL/PLATELET
Abs Immature Granulocytes: 0.03 10*3/uL (ref 0.00–0.07)
Basophils Absolute: 0 10*3/uL (ref 0.0–0.1)
Basophils Relative: 0 %
Eosinophils Absolute: 0 10*3/uL (ref 0.0–0.5)
Eosinophils Relative: 0 %
HCT: 42.6 % (ref 39.0–52.0)
Hemoglobin: 14.4 g/dL (ref 13.0–17.0)
Immature Granulocytes: 1 %
Lymphocytes Relative: 18 %
Lymphs Abs: 1.2 10*3/uL (ref 0.7–4.0)
MCH: 28.8 pg (ref 26.0–34.0)
MCHC: 33.8 g/dL (ref 30.0–36.0)
MCV: 85.2 fL (ref 80.0–100.0)
Monocytes Absolute: 0.5 10*3/uL (ref 0.1–1.0)
Monocytes Relative: 8 %
Neutro Abs: 4.8 10*3/uL (ref 1.7–7.7)
Neutrophils Relative %: 73 %
Platelets: 283 10*3/uL (ref 150–400)
RBC: 5 MIL/uL (ref 4.22–5.81)
RDW: 11.9 % (ref 11.5–15.5)
WBC: 6.6 10*3/uL (ref 4.0–10.5)
nRBC: 0 % (ref 0.0–0.2)

## 2018-12-22 LAB — COMPREHENSIVE METABOLIC PANEL
ALT: 37 U/L (ref 0–44)
AST: 35 U/L (ref 15–41)
Albumin: 4 g/dL (ref 3.5–5.0)
Alkaline Phosphatase: 52 U/L (ref 38–126)
Anion gap: 10 (ref 5–15)
BUN: 11 mg/dL (ref 6–20)
CO2: 27 mmol/L (ref 22–32)
Calcium: 9.2 mg/dL (ref 8.9–10.3)
Chloride: 98 mmol/L (ref 98–111)
Creatinine, Ser: 0.95 mg/dL (ref 0.61–1.24)
GFR calc Af Amer: >60 mL/min (ref >60)
GFR calc non Af Amer: >60 mL/min (ref >60)
Glucose, Bld: 117 mg/dL — ABNORMAL HIGH (ref 70–99)
Potassium: 4.4 mmol/L (ref 3.5–5.1)
Sodium: 135 mmol/L (ref 135–145)
Total Bilirubin: 0.7 mg/dL (ref 0.3–1.2)
Total Protein: 8.1 g/dL (ref 6.5–8.1)

## 2018-12-22 LAB — SARS CORONAVIRUS 2 (TAT 6-24 HRS): SARS Coronavirus 2: POSITIVE — AB

## 2018-12-22 LAB — TROPONIN I (HIGH SENSITIVITY): Troponin I (High Sensitivity): 3 ng/L (ref <18)

## 2018-12-22 MED ORDER — ACETAMINOPHEN 500 MG PO TABS
1000.0000 mg | ORAL_TABLET | Freq: Once | ORAL | Status: AC
  Administered 2018-12-22: 1000 mg via ORAL
  Filled 2018-12-22: qty 2

## 2018-12-22 MED ORDER — PROCHLORPERAZINE MALEATE 5 MG PO TABS
5.0000 mg | ORAL_TABLET | Freq: Once | ORAL | Status: AC
  Administered 2018-12-22: 5 mg via ORAL
  Filled 2018-12-22: qty 1

## 2018-12-22 MED ORDER — BENZONATATE 100 MG PO CAPS: 100.0000 mg | ORAL_CAPSULE | Freq: Three times a day (TID) | ORAL | 0 refills | Status: DC | PRN

## 2018-12-22 MED ORDER — BENZONATATE 100 MG PO CAPS
100.0000 mg | ORAL_CAPSULE | Freq: Once | ORAL | Status: AC
  Administered 2018-12-22: 05:00:00 100 mg via ORAL
  Filled 2018-12-22: qty 1

## 2018-12-22 MED ORDER — GUAIFENESIN 100 MG/5ML PO SOLN: 5.0000 mL | ORAL | 0 refills | Status: DC | PRN

## 2018-12-22 NOTE — ED Provider Notes (Signed)
[facility] Emergency Department Provider Note  ____________________________________________   First MD Initiated Contact with Patient 12/22/18 0321     (approximate)  I have reviewed the triage vital signs and the nursing notes.  History  Chief Complaint Cough    HPI Kyle Reynolds is a 38 y.o. male who presents to the emergency department for fevers, non-productive cough, SOB, body aches, fatigue, headache in the setting of COVID exposure.  Patient states his symptoms have been ongoing for approximately 10 days.  His wife had similar symptoms, and recently tested positive for COVID.  He denies any history of asthma, COPD, or tobacco use.   Past Medical Hx Past Medical History:  Diagnosis Date  . Arthritis     Problem List There are no active problems to display for this patient.   Past Surgical Hx Past Surgical History:  Procedure Laterality Date  . HERNIA REPAIR    . JOINT REPLACEMENT     shoulder    Medications Prior to Admission medications   Medication Sig Start Date End Date Taking? Authorizing Provider  benzonatate (TESSALON PERLES) 100 MG capsule Take 1 capsule (100 mg total) by mouth 3 (three) times daily as needed for cough. 02/06/18   Hinda Kehr, MD  cyclobenzaprine (FLEXERIL) 10 MG tablet Take 1 tablet (10 mg total) by mouth 3 (three) times daily as needed. 12/01/16   Sable Feil, PA-C  HYDROcodone-homatropine [facility]) 5-1.5 MG/5ML syrup Take 5 mLs by mouth every 6 (six) hours as needed for cough. 02/06/18   Hinda Kehr, MD  meloxicam (MOBIC) 15 MG tablet Take 1 tablet (15 mg total) by mouth daily. 12/01/16   Sable Feil, PA-C  ondansetron (ZOFRAN ODT) 4 MG disintegrating tablet Take 1 tablet (4 mg total) by mouth every 8 (eight) hours as needed. 07/23/18   Gregor Hams, MD  oxyCODONE-acetaminophen (ROXICET) 5-325 MG per tablet Take 1 tablet by mouth every 6 (six) hours as needed. 10/27/14   Harvest Dark, MD  tamsulosin (FLOMAX) 0.4 MG CAPS capsule Take 1 capsule (0.4 mg total) by mouth daily. 10/27/14   Harvest Dark, MD  traMADol (ULTRAM) 50 MG tablet Take 1 tablet (50 mg total) by mouth every 6 (six) hours as needed for moderate pain. 12/01/16   Sable Feil, PA-C    Allergies Patient has no known allergies.  Family Hx No family history on file.  Social Hx Social History   Tobacco Use  . Smoking status: Never Smoker  . Smokeless tobacco: Never Used  Substance Use Topics  . Alcohol use: No  . Drug use: Not on file     Review of Systems  Constitutional: + for fever, chills, body aches, fatigue. Eyes: Negative for visual changes. ENT: Negative for sore throat. Cardiovascular: Negative for chest pain. Respiratory: + for cough, shortness of breath. Gastrointestinal: Negative for nausea, vomiting.  Genitourinary: Negative for dysuria. Musculoskeletal: Negative for leg swelling. Skin: Negative for rash. Neurological: + for for headaches.   Physical Exam  Vital Signs: ED Triage Vitals  Enc Vitals Group     BP 12/21/18 2338 (!) 149/98     Pulse Rate 12/21/18 2338 (!) 106     Resp 12/21/18 2338 (!) 22     Temp 12/21/18 2338 99.1 F (37.3 C)     Temp Source 12/21/18 2338 Oral     SpO2 12/21/18 2338 97 %     Weight 12/21/18 2337 220 lb (99.8 kg)     Height  12/21/18 2337 5\' 10"  (1.778 m)     Head Circumference --      Peak Flow --      Pain Score 12/21/18 2335 9     Pain Loc --      Pain Edu? --      Excl. in [place]? --     Constitutional: Alert and oriented.  Appears fatigued.  Frequent dry coughing during exam. Head: Normocephalic. Atraumatic. Eyes: Conjunctivae clear. Sclera anicteric. Nose: No congestion. No rhinorrhea. Mouth/Throat: Wearing mask.  Neck: No stridor.   Cardiovascular: Normal rate, regular rhythm. Extremities well perfused. Respiratory: Normal respiratory effort.  Speaking in full sentences.  Frequent nonproductive cough.  No  tachypnea or accessory muscle use.  No respiratory distress. Musculoskeletal:  No deformities. Neurologic:  Normal speech and language. No gross focal neurologic deficits are appreciated.  Skin: Skin is warm, dry and intact. No rash noted. Psychiatric: Mood and affect are appropriate for situation.  EKG  Personally reviewed.   Rate: 102 Rhythm: sinus Axis: normal Intervals: WNL Sinus tachycardia No acute ischemic changes No STEMI    Radiology  XR: IMPRESSION:  Patchy peripheral airspace opacities in the lungs bilaterally  concerning for pneumonia, possibly COVID pneumonia.    Procedures  Procedure(s) performed (including critical care):  Procedures   Initial Impression / Assessment and Plan / ED Course  38 y.o. male who presents to the emergency department for fevers, non-productive cough, SOB, body aches, fatigue, headache in the setting of COVID exposure.   Symptoms and presentation seem most consistent with COVID, especially in the setting of his wife being tested positive.  X-ray seems consistent as well.  Labs initiated in triage without actionable derangements.  EKG without acute ischemic changes and troponin negative.  On initial evaluation patient had a low-grade tachycardia, which improved on recheck.  He remains hemodynamically stable.  No evidence of respiratory distress, oxygen saturation remains mid 90s and above.  As such, patient is stable for discharge.  Will provide Rx for cough for symptomatic treatment.  Advised continued supportive care at home and discussed return precautions.  COVID swab sent, patient understands that results will return in 6 to 24 hours, and that he requires quarantining until hearing results, along with hand hygiene, social distancing, etc.  He voices understanding of this and is comfortable with the plan and discharge.  Final Clinical Impression(s) / ED Diagnosis  Final diagnoses:  Suspected COVID-19 virus infection        Note:  This document was prepared using Dragon voice recognition software and may include unintentional dictation errors.   24., MD 12/22/18 [phone]

## 2018-12-22 NOTE — ED Notes (Signed)
Called patient in inform him of positive COVID result. Voice Mail not set up. No Message left. 

## 2018-12-22 NOTE — Discharge Instructions (Addendum)
Thank you for letting us take care of you in the emergency department.   At this time, your coronavirus swab results are pending. You should hear your results in 1-3 days if they are positive.   In the meantime, it is important to take precautions in case you are positive. This includes quarantining, wearing a mask, and social distancing.   Take over the counter acetaminophen as directed on the box to help with fevers as well as aches and pains.   Please return to the ER for any new or worsening symptoms, such as difficulty breathing, vomiting and diarrhea, or chest pain.

## 2018-12-22 NOTE — ED Notes (Signed)
Called patient in inform him of positive COVID result. Voice Mail not set up. No Message left.

## 2018-12-25 ENCOUNTER — Other Ambulatory Visit: Payer: Self-pay

## 2018-12-25 ENCOUNTER — Emergency Department: Payer: HRSA Program

## 2018-12-25 ENCOUNTER — Encounter: Payer: Self-pay | Admitting: Emergency Medicine

## 2018-12-25 ENCOUNTER — Emergency Department
Admission: EM | Admit: 2018-12-25 | Discharge: 2018-12-25 | Disposition: A | Discharge status: Home / Self Care | Payer: HRSA Program | Attending: Student | Admitting: Student

## 2018-12-25 DIAGNOSIS — Z96619 Presence of unspecified artificial shoulder joint: Secondary | ICD-10-CM | POA: Diagnosis not present

## 2018-12-25 DIAGNOSIS — U071 COVID-19: Secondary | ICD-10-CM | POA: Diagnosis not present

## 2018-12-25 DIAGNOSIS — J189 Pneumonia, unspecified organism: Secondary | ICD-10-CM | POA: Diagnosis not present

## 2018-12-25 DIAGNOSIS — R05 Cough: Secondary | ICD-10-CM | POA: Diagnosis present

## 2018-12-25 DIAGNOSIS — Z79899 Other long term (current) drug therapy: Secondary | ICD-10-CM | POA: Insufficient documentation

## 2018-12-25 MED ORDER — ALBUTEROL SULFATE HFA 108 (90 BASE) MCG/ACT IN AERS: 2.0000 | INHALATION_SPRAY | Freq: Four times a day (QID) | RESPIRATORY_TRACT | 0 refills | Status: AC | PRN

## 2018-12-25 MED ORDER — AZITHROMYCIN 250 MG PO TABS: 250.0000 mg | ORAL_TABLET | Freq: Every day | ORAL | 0 refills | Status: AC

## 2018-12-25 MED ORDER — ACETAMINOPHEN 325 MG PO TABS
650.0000 mg | ORAL_TABLET | Freq: Once | ORAL | Status: AC
  Administered 2018-12-25: 650 mg via ORAL
  Filled 2018-12-25: qty 2

## 2018-12-25 MED ORDER — AZITHROMYCIN 500 MG PO TABS
500.0000 mg | ORAL_TABLET | Freq: Once | ORAL | Status: AC
  Administered 2018-12-25: 500 mg via ORAL
  Filled 2018-12-25: qty 1

## 2018-12-25 MED ORDER — PREDNISONE 20 MG PO TABS
60.0000 mg | ORAL_TABLET | Freq: Once | ORAL | Status: AC
  Administered 2018-12-25: 60 mg via ORAL
  Filled 2018-12-25: qty 3

## 2018-12-25 MED ORDER — PREDNISONE 20 MG PO TABS: ORAL_TABLET | ORAL | 0 refills | Status: AC

## 2018-12-25 NOTE — ED Notes (Signed)
Pt ambulatory from triage, [facility] on exertion with mild cough, A&Ox4.

## 2018-12-25 NOTE — ED Provider Notes (Signed)
[facility] Emergency Department Provider Note ____________________________________________  Time seen: 1655  I have reviewed the triage vital signs and the nursing notes.  HISTORY  Chief Complaint  Cough, Fever, and COVID positive  History limited by [place] language.  Interpreter Estill Bamberg B.)  Present for interview and exam.  HPI Kyle Reynolds is a 38 y.o. male presents himself to the ED for evaluation of ongoing cough, fever, body aches.  Patient reports symptoms have been persistent for the last 2 weeks.  He was evaluated here in the ED  for similar symptoms, and was confirmed to be Covid positive at that time.  He was discharged with Ladona Ridgel and instructions to take over-the-counter cough and cold medications.  He presents to the ED for evaluation of ongoing symptoms.  Past Medical History:  Diagnosis Date  . Arthritis     There are no active problems to display for this patient.   Past Surgical History:  Procedure Laterality Date  . HERNIA REPAIR    . JOINT REPLACEMENT     shoulder    Prior to Admission medications   Medication Sig Start Date End Date Taking? Authorizing Provider  albuterol (VENTOLIN HFA) 108 (90 Base) MCG/ACT inhaler Inhale 2 puffs into the lungs every 6 (six) hours as needed for wheezing or shortness of breath. 12/25/18   Cieara Stierwalt, Dannielle Karvonen, PA-C  azithromycin (ZITHROMAX Z-PAK) 250 MG tablet Take 1 tablet (250 mg total) by mouth daily for 4 days. 12/26/18 12/30/18  Maddalena Linarez, Dannielle Karvonen, PA-C  benzonatate (TESSALON PERLES) 100 MG capsule Take 1 capsule (100 mg total) by mouth 3 (three) times daily as needed for cough. 02/06/18   Hinda Kehr, MD  benzonatate (TESSALON PERLES) 100 MG capsule Take 1 capsule (100 mg total) by mouth 3 (three) times daily as needed for cough. 12/22/18 12/22/19  Lilia Pro., MD  cyclobenzaprine (FLEXERIL) 10 MG tablet Take 1 tablet (10 mg total) by mouth 3 (three) times daily  as needed. 12/01/16   Sable Feil, PA-C  guaiFENesin (ROBITUSSIN) 100 MG/5ML SOLN Take 5 mLs (100 mg total) by mouth every 4 (four) hours as needed for cough or to loosen phlegm. 12/22/18   Lilia Pro., MD  HYDROcodone-homatropine (HYCODAN) 5-1.5 MG/5ML syrup Take 5 mLs by mouth every 6 (six) hours as needed for cough. 02/06/18   Hinda Kehr, MD  meloxicam (MOBIC) 15 MG tablet Take 1 tablet (15 mg total) by mouth daily. 12/01/16   Sable Feil, PA-C  ondansetron (ZOFRAN ODT) 4 MG disintegrating tablet Take 1 tablet (4 mg total) by mouth every 8 (eight) hours as needed. 07/23/18   Gregor Hams, MD  oxyCODONE-acetaminophen (ROXICET) 5-325 MG per tablet Take 1 tablet by mouth every 6 (six) hours as needed. 10/27/14   Harvest Dark, MD  predniSONE (DELTASONE) 20 MG tablet Take 2 tabs daily x 3 days; Take 1 tab daily x 3 days; Take 0.5 tabs daily x 4 days 12/26/18 01/04/19  Margarita Croke, Dannielle Karvonen, PA-C  tamsulosin (FLOMAX) 0.4 MG CAPS capsule Take 1 capsule (0.4 mg total) by mouth daily. 10/27/14   Harvest Dark, MD  traMADol (ULTRAM) 50 MG tablet Take 1 tablet (50 mg total) by mouth every 6 (six) hours as needed for moderate pain. 12/01/16   Sable Feil, PA-C    Allergies Patient has no known allergies.  No family history on file.  Social History Social History   Tobacco Use  . Smoking status: Never  Smoker  . Smokeless tobacco: Never Used  Substance Use Topics  . Alcohol use: No  . Drug use: Not on file    Review of Systems  Constitutional: Positive for fever. Eyes: Negative for visual changes. ENT: Negative for sore throat. Cardiovascular: Negative for chest pain. Respiratory: Negative for shortness of breath. Reports cough Gastrointestinal: Negative for abdominal pain, vomiting and diarrhea. Genitourinary: Negative for dysuria. Musculoskeletal: Negative for back pain. Skin: Negative for rash. Neurological: Negative for headaches, focal weakness or  numbness. ____________________________________________  PHYSICAL EXAM:  VITAL SIGNS: ED Triage Vitals  Enc Vitals Group     BP 12/25/18 1540 139/77     Pulse Rate 12/25/18 1540 (!) 112     Resp 12/25/18 1540 18     Temp 12/25/18 1540 100.1 F (37.8 C)     Temp Source 12/25/18 1540 Oral     SpO2 12/25/18 1540 96 %     Weight 12/25/18 1544 220 lb (99.8 kg)     Height 12/25/18 1544 5\' 10"  (1.778 m)     Head Circumference --      Peak Flow --      Pain Score 12/25/18 1543 9     Pain Loc --      Pain Edu? --      Excl. in [place]? --     Constitutional: Alert and oriented. Well appearing and in no distress. Head: Normocephalic and atraumatic. Eyes: Conjunctivae are normal. PERRL. Normal extraocular movements Ears: Canals clear. TMs intact bilaterally. Neck: Supple. No thyromegaly. Hematological/Lymphatic/Immunological: No cervical lymphadenopathy. Cardiovascular: Normal rate, regular rhythm. Normal distal pulses. Respiratory: Normal respiratory effort. No wheezes/rales/rhonchi. Gastrointestinal: Soft and nontender. No distention. Musculoskeletal: Nontender with normal range of motion in all extremities.  Neurologic:  Normal gait without ataxia. Normal speech and language. No gross focal neurologic deficits are appreciated. ____________________________________________   LABS (pertinent positives/negatives) Labs Reviewed - No data to display ____________________________________________   RADIOLOGY  CXR IMPRESSION: 1. Mild diffuse interstitial opacities bilaterally. This may reflect pulmonary edema or atypical pneumonia. 2. Minimal bilateral peripheral airspace opacities may reflect COVID-19 pneumonia.  I, 12/27/18, personally viewed and evaluated these images (plain radiographs) as part of my medical decision making, as well as reviewing the written report by the radiologist. ____________________________________________  PROCEDURES  Tylenol 650 mg  p.o. Prednisone 60 mg p.o. Azithromycin 500 mg p.o. Procedures ____________________________________________  INITIAL IMPRESSION / ASSESSMENT AND PLAN / ED COURSE  Patient with ED evaluation of ongoing symptoms with a recent confirm Covid test.  Patient is stable at this time with complaints of ongoing cough and congestion.  Chest x-ray reveals atypical changes consistent with a likely viral pneumonia.  Patient will be treated empirically with prednisone, albuterol inhaler, and a azithromycin.  Initial doses are provided in the ED.  He is discharged with prescriptions and instructions to continue with previously prescribed cough medicine.  He is advised to remain in quarantine until his symptoms improve.  Return precautions have been reviewed, and patient verbalizes understanding.  Kyle Reynolds was evaluated in Emergency Department on 12/25/2018 for the symptoms described in the history of present illness. He was evaluated in the context of the global COVID-19 pandemic, which necessitated consideration that the patient might be at risk for infection with the SARS-CoV-2 virus that causes COVID-19. Institutional protocols and algorithms that pertain to the evaluation of patients at risk for COVID-19 are in a state of rapid change based on information released by regulatory bodies including the CDC and  federal and state organizations. These policies and algorithms were followed during the patient's care in the ED. ____________________________________________  FINAL CLINICAL IMPRESSION(S) / ED DIAGNOSES  Final diagnoses:  Pneumonia due to COVID-19 virus      Lissa HoardMenshew, Shikita Vaillancourt V Bacon, PA-C 12/25/18 2018    Miguel AschoffMonks, Sarah L., MD 12/26/18 2012

## 2018-12-25 NOTE — ED Triage Notes (Signed)
Cough, fever, body aches x 2 weeks. States has positive test for COVID here.

## 2018-12-25 NOTE — Discharge Instructions (Signed)
You are being treated for a viral pneumonia caused by COVID. Take the prescription medicines as directed. Continue to take OTC Tylenol and Delsym for fevers and cough. Drink plenty of fluids to prevent dehydration. Return to the ED for worsening cough or shortness of breath.   Est siendo tratado por una neumona viral causada por COVID. Tome los medicamentos recetados segn las indicaciones. Contine tomando Tylenol y Delsym de venta libre para la fiebre y la tos. Beba muchos lquidos para Environmental manager. Regrese al servicio de urgencias si la tos o la falta de aire empeoran.

## 2019-01-03 ENCOUNTER — Other Ambulatory Visit: Payer: Self-pay

## 2019-01-03 DIAGNOSIS — U071 COVID-19: Secondary | ICD-10-CM | POA: Insufficient documentation

## 2019-01-03 DIAGNOSIS — Z96619 Presence of unspecified artificial shoulder joint: Secondary | ICD-10-CM | POA: Insufficient documentation

## 2019-01-03 DIAGNOSIS — R079 Chest pain, unspecified: Secondary | ICD-10-CM | POA: Insufficient documentation

## 2019-01-03 DIAGNOSIS — J988 Other specified respiratory disorders: Secondary | ICD-10-CM | POA: Insufficient documentation

## 2019-01-03 DIAGNOSIS — Z79899 Other long term (current) drug therapy: Secondary | ICD-10-CM | POA: Insufficient documentation

## 2019-01-03 DIAGNOSIS — B9789 Other viral agents as the cause of diseases classified elsewhere: Secondary | ICD-10-CM | POA: Insufficient documentation

## 2019-01-03 LAB — CBC
HCT: 43.7 % (ref 39.0–52.0)
Hemoglobin: 14.6 g/dL (ref 13.0–17.0)
MCH: 28.8 pg (ref 26.0–34.0)
MCHC: 33.4 g/dL (ref 30.0–36.0)
MCV: 86.2 fL (ref 80.0–100.0)
Platelets: 752 10*3/uL — ABNORMAL HIGH (ref 150–400)
RBC: 5.07 MIL/uL (ref 4.22–5.81)
RDW: 12.6 % (ref 11.5–15.5)
WBC: 11.5 10*3/uL — ABNORMAL HIGH (ref 4.0–10.5)
nRBC: 0 % (ref 0.0–0.2)

## 2019-01-03 NOTE — ED Triage Notes (Signed)
Pt reports he is COVID + and today pt started to have increased SOB and "feeling hot." Pt diagnosed on 11/21. Pt was given medication from previous visit that pt reports he has taken.

## 2019-01-04 ENCOUNTER — Emergency Department: Payer: Self-pay

## 2019-01-04 ENCOUNTER — Emergency Department
Admission: EM | Admit: 2019-01-04 | Discharge: 2019-01-04 | Disposition: A | Discharge status: Home / Self Care | Payer: Self-pay | Attending: Emergency Medicine | Admitting: Emergency Medicine

## 2019-01-04 DIAGNOSIS — U071 COVID-19: Secondary | ICD-10-CM

## 2019-01-04 DIAGNOSIS — J988 Other specified respiratory disorders: Secondary | ICD-10-CM

## 2019-01-04 DIAGNOSIS — R0602 Shortness of breath: Secondary | ICD-10-CM

## 2019-01-04 DIAGNOSIS — B9789 Other viral agents as the cause of diseases classified elsewhere: Secondary | ICD-10-CM

## 2019-01-04 LAB — BASIC METABOLIC PANEL
Anion gap: 13 (ref 5–15)
BUN: 12 mg/dL (ref 6–20)
CO2: 22 mmol/L (ref 22–32)
Calcium: 9.6 mg/dL (ref 8.9–10.3)
Chloride: 103 mmol/L (ref 98–111)
Creatinine, Ser: 0.87 mg/dL (ref 0.61–1.24)
GFR calc Af Amer: >60 mL/min (ref >60)
GFR calc non Af Amer: >60 mL/min (ref >60)
Glucose, Bld: 158 mg/dL — ABNORMAL HIGH (ref 70–99)
Potassium: 3.9 mmol/L (ref 3.5–5.1)
Sodium: 138 mmol/L (ref 135–145)

## 2019-01-04 LAB — TROPONIN I (HIGH SENSITIVITY): Troponin I (High Sensitivity): 4 ng/L (ref <18)

## 2019-01-04 MED ORDER — HYDROCODONE-HOMATROPINE 5-1.5 MG/5ML PO SYRP: 5.0000 mL | ORAL_SOLUTION | Freq: Four times a day (QID) | ORAL | 0 refills | Status: DC | PRN

## 2019-01-04 MED ORDER — BENZONATATE 100 MG PO CAPS: 100.0000 mg | ORAL_CAPSULE | Freq: Three times a day (TID) | ORAL | 0 refills | Status: DC | PRN

## 2019-01-04 NOTE — ED Provider Notes (Addendum)
[facility] Emergency Department Provider Note  ____________________________________________   First MD Initiated Contact with Patient 01/04/19 0148     (approximate)  I have reviewed the triage vital signs and the nursing notes.   HISTORY  Chief Complaint Shortness of Breath and Chest Pain   The patient and/or family speak(s) Spanish.  They understand they have the right to the use of a hospital interpreter, however at this time they prefer to speak directly with me in Spanish.  They know that they can ask for an interpreter at any time.   HPI Kyle Reynolds is a 38 y.o. male previously diagnosed with COVID-19 and who has now had a total of 4 visits to the emergency department presents tonight for evaluation of persistent shortness of breath and "feeling hot".  He was originally diagnosed a little over a week ago.  He was given prescriptions and said he is almost finished with his steroids.  He was not sure about the name of the other medications he is taking.  He was worried because his symptoms have not gone away.  He reports that they are moderate in severity and nothing in particular makes them better and exertion makes them worse.  He is still ambulatory, eating and drinking, has body aches all over, and feels poorly in general.         Past Medical History:  Diagnosis Date  . Arthritis     There are no active problems to display for this patient.   Past Surgical History:  Procedure Laterality Date  . HERNIA REPAIR    . JOINT REPLACEMENT     shoulder    Prior to Admission medications   Medication Sig Start Date End Date Taking? Authorizing Provider  albuterol (VENTOLIN HFA) 108 (90 Base) MCG/ACT inhaler Inhale 2 puffs into the lungs every 6 (six) hours as needed for wheezing or shortness of breath. 12/25/18   Menshew, Charlesetta Ivory, PA-C  benzonatate (TESSALON PERLES) 100 MG capsule Take 1 capsule (100 mg total) by mouth 3  (three) times daily as needed for cough. 01/04/19   Loleta Rose, MD  cyclobenzaprine (FLEXERIL) 10 MG tablet Take 1 tablet (10 mg total) by mouth 3 (three) times daily as needed. 12/01/16   Joni Reining, PA-C  guaiFENesin (ROBITUSSIN) 100 MG/5ML SOLN Take 5 mLs (100 mg total) by mouth every 4 (four) hours as needed for cough or to loosen phlegm. 12/22/18   Miguel Aschoff., MD  HYDROcodone-homatropine (HYCODAN) 5-1.5 MG/5ML syrup Take 5 mLs by mouth every 6 (six) hours as needed for cough. 01/04/19   Loleta Rose, MD  meloxicam (MOBIC) 15 MG tablet Take 1 tablet (15 mg total) by mouth daily. 12/01/16   Joni Reining, PA-C  ondansetron (ZOFRAN ODT) 4 MG disintegrating tablet Take 1 tablet (4 mg total) by mouth every 8 (eight) hours as needed. 07/23/18   Darci Current, MD  oxyCODONE-acetaminophen (ROXICET) 5-325 MG per tablet Take 1 tablet by mouth every 6 (six) hours as needed. 10/27/14   Minna Antis, MD  predniSONE (DELTASONE) 20 MG tablet Take 2 tabs daily x 3 days; Take 1 tab daily x 3 days; Take 0.5 tabs daily x 4 days 12/26/18 01/04/19  Menshew, Charlesetta Ivory, PA-C  tamsulosin (FLOMAX) 0.4 MG CAPS capsule Take 1 capsule (0.4 mg total) by mouth daily. 10/27/14   Minna Antis, MD  traMADol (ULTRAM) 50 MG tablet Take 1 tablet (50 mg total) by mouth every 6 (  six) hours as needed for moderate pain. 12/01/16   Joni ReiningSmith, Ronald K, PA-C    Allergies Patient has no known allergies.  No family history on file.  Social History Social History   Tobacco Use  . Smoking status: Never Smoker  . Smokeless tobacco: Never Used  Substance Use Topics  . Alcohol use: No  . Drug use: Not on file    Review of Systems Constitutional: Subjective fever and generalized body aches, malaise and fatigue. Eyes: No visual changes. ENT: No sore throat. Cardiovascular: Denies chest pain. Respiratory: Shortness of breath, intermittent cough. Gastrointestinal: No abdominal pain.  Some nausea, no  vomiting.  No diarrhea.  No constipation. Genitourinary: Negative for dysuria. Musculoskeletal: Negative for neck pain.  Negative for back pain. Integumentary: Negative for rash. Neurological: Negative for headaches, focal weakness or numbness.   ____________________________________________   PHYSICAL EXAM:  VITAL SIGNS: ED Triage Vitals  Enc Vitals Group     BP 01/03/19 2312 133/88     Pulse Rate 01/03/19 2312 (!) 119     Resp 01/03/19 2312 20     Temp 01/03/19 2312 98.8 F (37.1 C)     Temp Source 01/03/19 2312 Oral     SpO2 01/03/19 2312 97 %     Weight 01/03/19 2312 99.8 kg (220 lb)     Height 01/03/19 2312 1.778 m (5\' 10" )     Head Circumference --      Peak Flow --      Pain Score 01/04/19 0202 0     Pain Loc --      Pain Edu? --      Excl. in GC? --     Constitutional: Alert and oriented.  Appears uncomfortable as a from a respiratory virus but in no acute distress, nontoxic appearance. Eyes: Conjunctivae are normal.  Head: Atraumatic. Nose: No congestion/rhinnorhea. Mouth/Throat: Patient is wearing a mask. Neck: No stridor.  No meningeal signs.   Cardiovascular: Normal rate, regular rhythm. Good peripheral circulation. Grossly normal heart sounds. Respiratory: Normal respiratory effort.  No retractions. Gastrointestinal: Soft and nontender. No distention.  Musculoskeletal: No lower extremity tenderness nor edema. No gross deformities of extremities. Neurologic:  Normal speech and language. No gross focal neurologic deficits are appreciated.  Skin:  Skin is warm, dry and intact. Psychiatric: Mood and affect are normal. Speech and behavior are normal.  ____________________________________________   LABS (all labs ordered are listed, but only abnormal results are displayed)  Labs Reviewed  BASIC METABOLIC PANEL - Abnormal; Notable for the following components:      Result Value   Glucose, Bld 158 (*)    All other components within normal limits  CBC -  Abnormal; Notable for the following components:   WBC 11.5 (*)    Platelets 752 (*)    All other components within normal limits  TROPONIN I (HIGH SENSITIVITY)   ____________________________________________  EKG  ED ECG REPORT I, Loleta Roseory Emelie Newsom, the attending physician, personally viewed and interpreted this ECG.  Date: 01/03/2019 EKG Time: 23: 15 Rate: 103 Rhythm: Borderline sinus tachycardia QRS Axis: normal Intervals: normal ST/T Wave abnormalities: Non-specific ST segment / T-wave changes, but no clear evidence of acute ischemia. Narrative Interpretation: no definitive evidence of acute ischemia; does not meet STEMI criteria.  ____________________________________________  RADIOLOGY I, Loleta Roseory Johnnette Laux, personally viewed and evaluated these images (plain radiographs) as part of my medical decision making, as well as reviewing the written report by the radiologist.  ED MD interpretation: No acute abnormalities identified  on chest x-ray  Official radiology report(s): Dg Chest Port 1 View  Result Date: 01/04/2019 CLINICAL DATA:  COVID positive.  Shortness of breath EXAM: PORTABLE CHEST 1 VIEW COMPARISON:  12/25/2018 FINDINGS: Heart and mediastinal contours are within normal limits. No focal opacities or effusions. No acute bony abnormality. IMPRESSION: No active disease. Electronically Signed   By: Rolm Baptise M.D.   On: 01/04/2019 01:42    ____________________________________________   PROCEDURES   Procedure(s) performed (including Critical Care):  Procedures   ____________________________________________   INITIAL IMPRESSION / MDM / [place] / ED COURSE  As part of my medical decision making, I reviewed the following data within the [place] notes reviewed and incorporated, Labs reviewed , EKG interpreted , Old chart reviewed, Radiograph reviewed  and Notes from prior ED visits and reviewed [place] controlled substance  database.   Differential diagnosis includes, but is not limited to, persistent COVID-19 symptoms, bacterial superinfection, PE.  The patient is generally well-appearing in spite of his persistent symptoms.  He is understandably anxious about his diagnosis but he seems to be improving clinically; a prior chest x-ray demonstrated some viral infiltrates but currently his chest x-ray is clear per radiology.  His lab work is reassuring and his vital signs are stable.  I provided reassurance and encouraged isolation at home until his symptoms resolved.  I will give him a prescription for cough medicine and another prescription for Tessalon.  He does not need any additional steroids (he said he has 1 pill left today) and he already has an albuterol inhaler.  I gave my usual customary return precautions but explained that he can continue to be symptomatic for weeks.  He understands and agrees with the plan.          ____________________________________________  FINAL CLINICAL IMPRESSION(S) / ED DIAGNOSES  Final diagnoses:  COVID-19 virus infection  Viral respiratory infection     MEDICATIONS GIVEN DURING THIS VISIT:  Medications - No data to display   ED Discharge Orders         Ordered    HYDROcodone-homatropine (HYCODAN) 5-1.5 MG/5ML syrup  Every 6 hours PRN     01/04/19 0310    benzonatate (TESSALON PERLES) 100 MG capsule  3 times daily PRN     01/04/19 0310          *Please note:  Cordarrius Keelyn Fjelstad was evaluated in Emergency Department on 01/04/2019 for the symptoms described in the history of present illness. He was evaluated in the context of the global COVID-19 pandemic, which necessitated consideration that the patient might be at risk for infection with the SARS-CoV-2 virus that causes COVID-19. Institutional protocols and algorithms that pertain to the evaluation of patients at risk for COVID-19 are in a state of rapid change based on information released by regulatory  bodies including the CDC and federal and state organizations. These policies and algorithms were followed during the patient's care in the ED.  Some ED evaluations and interventions may be delayed as a result of limited staffing during the pandemic.*  Note:  This document was prepared using Dragon voice recognition software and may include unintentional dictation errors.   Hinda Kehr, MD 01/04/19 2130    Hinda Kehr, MD 01/04/19 [phone]

## 2019-04-23 ENCOUNTER — Ambulatory Visit: Payer: Self-pay | Attending: Internal Medicine

## 2019-04-23 DIAGNOSIS — Z23 Encounter for immunization: Secondary | ICD-10-CM

## 2019-04-23 NOTE — Progress Notes (Signed)
   Covid-19 Vaccination Clinic  Name:  Kyle Reynolds    MRN: [id number] DOB: 1980-09-18  04/23/2019  Mr. Kyle Reynolds was observed post Covid-19 immunization for 15 minutes without incident. He was provided with Vaccine Information Sheet and instruction to access the V-Safe system.   Mr. Kyle Reynolds was instructed to call 911 with any severe reactions post vaccine: Marland Kitchen Difficulty breathing  . Swelling of face and throat  . A fast heartbeat  . A bad rash all over body  . Dizziness and weakness   Immunizations Administered    Name Date Dose VIS Date Route   Pfizer COVID-19 Vaccine 04/23/2019  9:48 AM 0.3 mL 01/14/2019 Intramuscular   Manufacturer: [employer], [employer]   Lot: [id number]   NDC: [address]-9

## 2019-05-14 ENCOUNTER — Ambulatory Visit: Payer: Self-pay | Attending: Internal Medicine

## 2019-05-14 DIAGNOSIS — Z23 Encounter for immunization: Secondary | ICD-10-CM

## 2019-05-14 NOTE — Progress Notes (Signed)
   Covid-19 Vaccination Clinic  Name:  Kyle Reynolds    MRN: [id number] DOB: 07/12/1980  05/14/2019  Mr. Kyle Reynolds was observed post Covid-19 immunization for 15 minutes without incident. He was provided with Vaccine Information Sheet and instruction to access the V-Safe system.   Mr. Kyle Reynolds was instructed to call 911 with any severe reactions post vaccine: Marland Kitchen Difficulty breathing  . Swelling of face and throat  . A fast heartbeat  . A bad rash all over body  . Dizziness and weakness   Immunizations Administered    Name Date Dose VIS Date Route   Pfizer COVID-19 Vaccine 05/14/2019 10:21 AM 0.3 mL 01/14/2019 Intramuscular   Manufacturer: [employer], [employer]   Lot: [id number]   NDC: [address]-1

## 2020-11-27 IMAGING — DX PORTABLE CHEST - 1 VIEW
2 series · 2 of 2 positions shown · non-contrast
Comparison: 02/05/2018

CLINICAL DATA: Fever, chills.

EXAM:
PORTABLE CHEST 1 VIEW

[chest ap (1 of 2)]
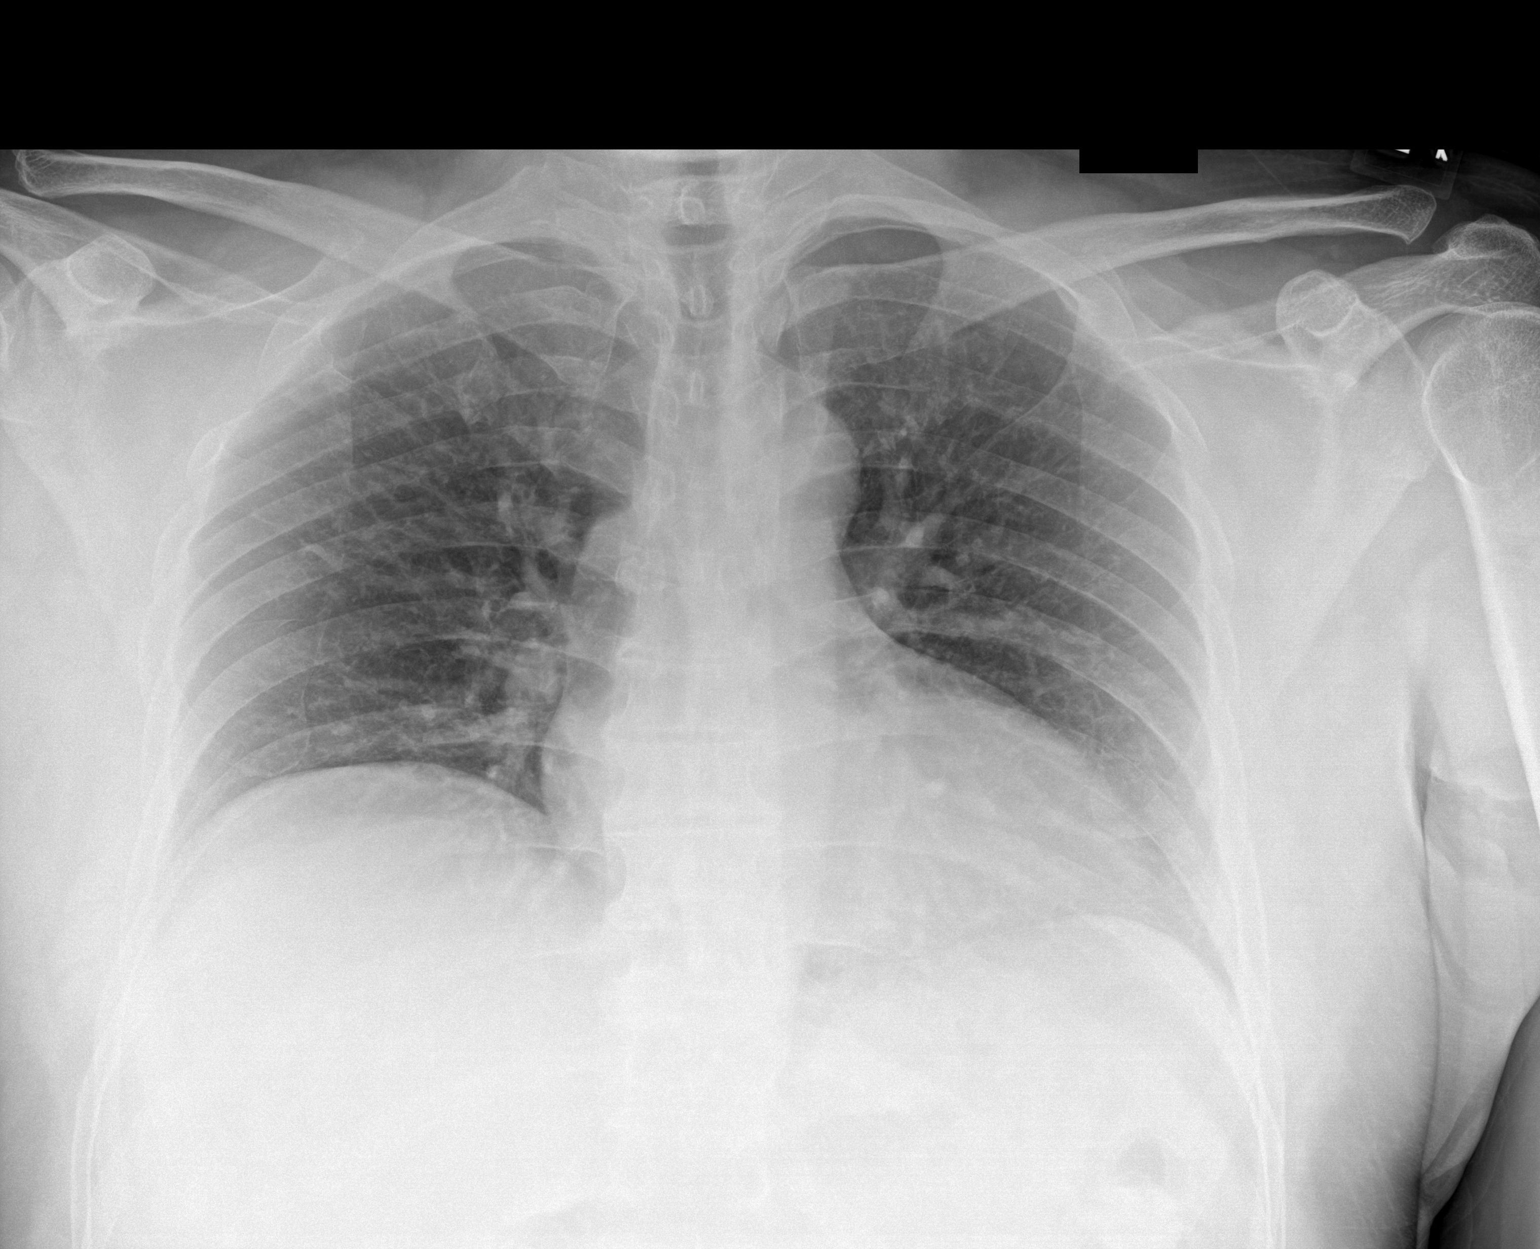

[chest ap (2 of 2)]
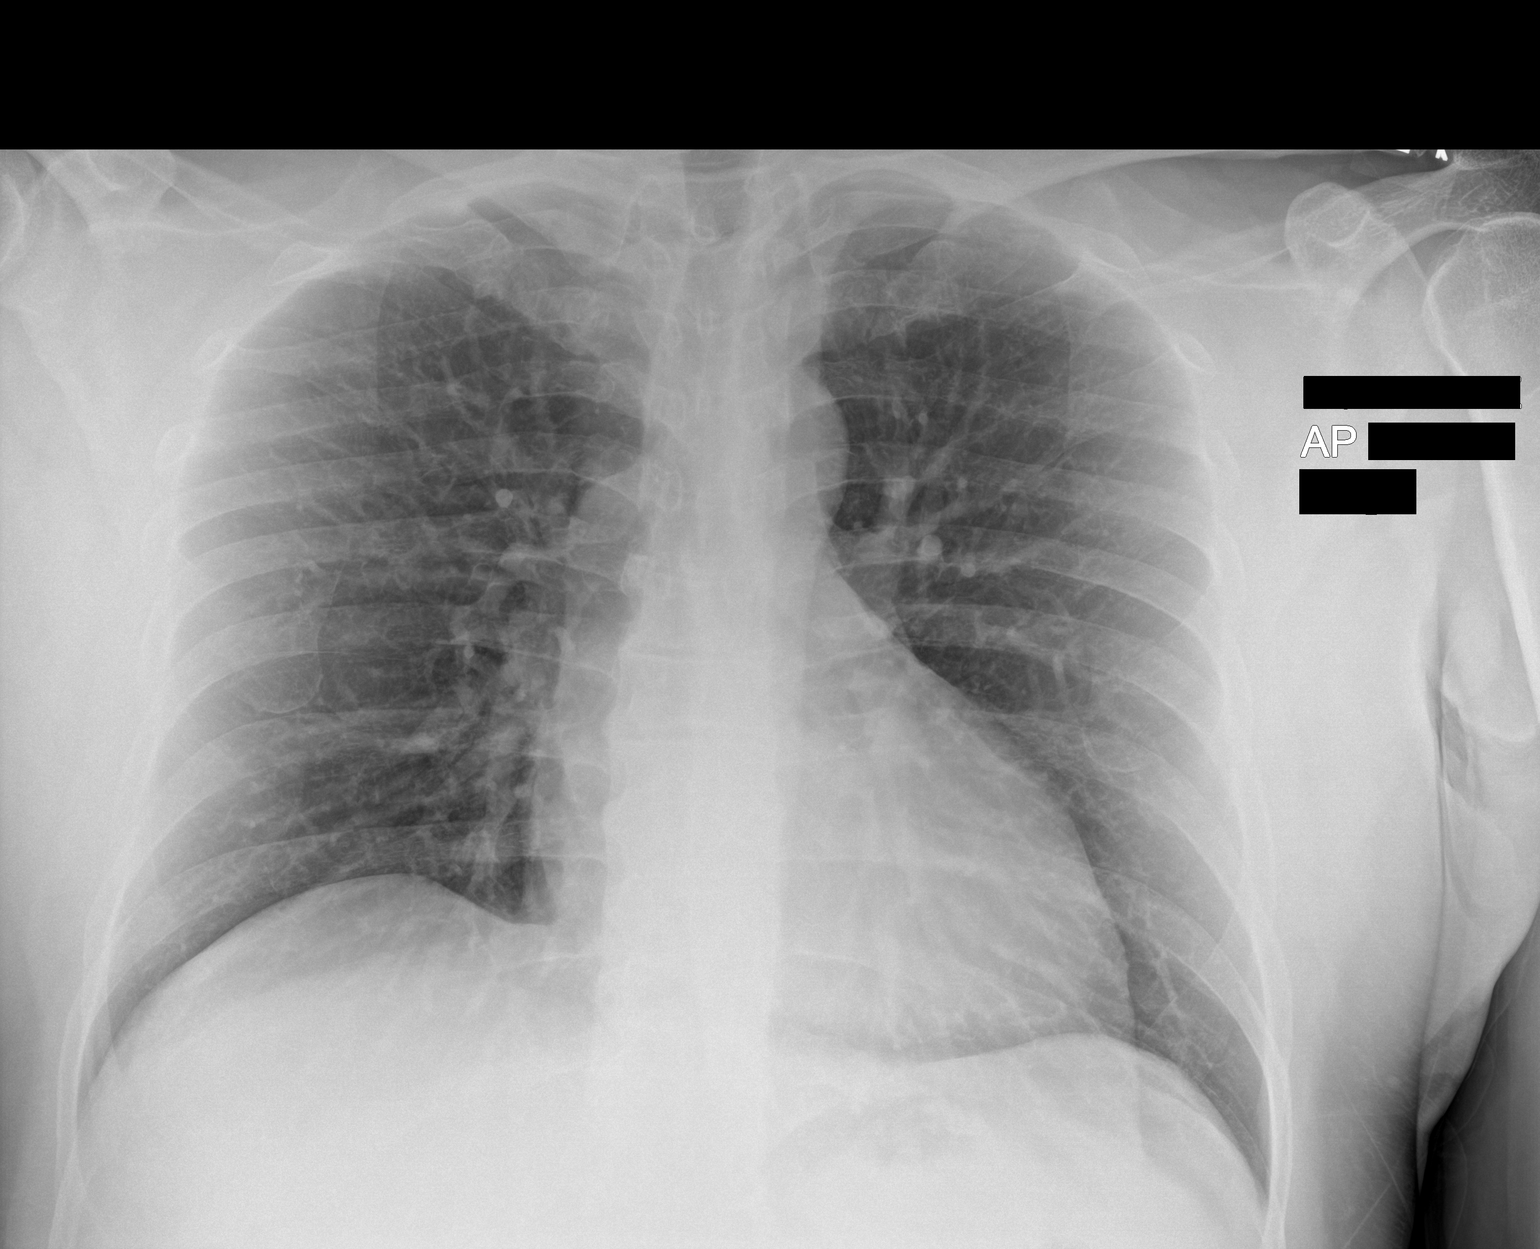

[2 of 2 positions shown; findings below may reference images not displayed]

FINDINGS: Heart and mediastinal contours are within normal limits. No focal
opacities or effusions. No acute bony abnormality.
IMPRESSION: No active disease.

## 2021-01-28 ENCOUNTER — Emergency Department: Payer: Self-pay

## 2021-01-28 ENCOUNTER — Other Ambulatory Visit: Payer: Self-pay

## 2021-01-28 ENCOUNTER — Emergency Department
Admission: EM | Admit: 2021-01-28 | Discharge: 2021-01-28 | Disposition: A | Discharge status: Home / Self Care | Payer: Self-pay | Attending: Emergency Medicine | Admitting: Emergency Medicine

## 2021-01-28 DIAGNOSIS — M5137 Other intervertebral disc degeneration, lumbosacral region: Secondary | ICD-10-CM | POA: Insufficient documentation

## 2021-01-28 DIAGNOSIS — U071 COVID-19: Secondary | ICD-10-CM | POA: Insufficient documentation

## 2021-01-28 DIAGNOSIS — M5430 Sciatica, unspecified side: Secondary | ICD-10-CM | POA: Insufficient documentation

## 2021-01-28 DIAGNOSIS — Z96619 Presence of unspecified artificial shoulder joint: Secondary | ICD-10-CM | POA: Insufficient documentation

## 2021-01-28 LAB — RESP PANEL BY RT-PCR (FLU A&B, COVID) ARPGX2
Influenza A by PCR: NEGATIVE
Influenza B by PCR: NEGATIVE
SARS Coronavirus 2 by RT PCR: POSITIVE — AB

## 2021-01-28 MED ORDER — NIRMATRELVIR/RITONAVIR (PAXLOVID)TABLET
3.0000 | ORAL_TABLET | Freq: Two times a day (BID) | ORAL | 0 refills | Status: AC
Start: 2021-01-28 — End: 2021-02-02

## 2021-01-28 MED ORDER — LIDOCAINE 5 % EX PTCH: 1.0000 | MEDICATED_PATCH | Freq: Two times a day (BID) | CUTANEOUS | 0 refills | Status: AC | PRN

## 2021-01-28 MED ORDER — NABUMETONE 750 MG PO TABS: 750.0000 mg | ORAL_TABLET | Freq: Two times a day (BID) | ORAL | 1 refills | Status: AC

## 2021-01-28 MED ORDER — CYCLOBENZAPRINE HCL 5 MG PO TABS: 5.0000 mg | ORAL_TABLET | Freq: Three times a day (TID) | ORAL | 0 refills | Status: AC | PRN

## 2021-01-28 NOTE — ED Provider Notes (Signed)
[facility] Emergency Department Provider Note ____________________________________________  Time seen: 1614  I have reviewed the triage vital signs and the nursing notes.  HISTORY  Chief Complaint  URI and Back Pain  History limited by Spanish language.  Interpreter Marchelle Folks B.) present for interview and exam.  HPI Kyle Reynolds is a 40 y.o. male presents to the ED accompanied by family, for evaluation of an acute complaint of intermittent cough congestion as well as a chronic complaint of 4 months of low back pain with some left lower extremity referral.  Patient denies any fevers but experienced cough and congestion for the past several days.  As it relates to the back pain denies any bladder or bowel incontinence, foot drop, saddle anesthesia.  Past Medical History:  Diagnosis Date   Arthritis     There are no problems to display for this patient.   Past Surgical History:  Procedure Laterality Date   HERNIA REPAIR     JOINT REPLACEMENT     shoulder    Prior to Admission medications   Medication Sig Start Date End Date Taking? Authorizing Provider  cyclobenzaprine (FLEXERIL) 5 MG tablet Take 1 tablet (5 mg total) by mouth 3 (three) times daily as needed. 01/28/21  Yes Cassey Hurrell, Charlesetta Ivory, PA-C  lidocaine (LIDODERM) 5 % Place 1 patch onto the skin every 12 (twelve) hours as needed for up to 10 days. Remove & Discard patch after 12 hours of wear each day. 01/28/21 02/07/21 Yes Jaquel Glassburn, Charlesetta Ivory, PA-C  nabumetone (RELAFEN) 750 MG tablet Take 1 tablet (750 mg total) by mouth 2 (two) times daily. 01/28/21 03/29/21 Yes Poppi Scantling, Charlesetta Ivory, PA-C  nirmatrelvir/ritonavir EUA (PAXLOVID) 20 x 150 MG & 10 x 100MG  TABS Take 3 tablets by mouth 2 (two) times daily for 5 days. Patient GFR is WNL. Take nirmatrelvir (150 mg) two tablets twice daily for 5 days and ritonavir (100 mg) one tablet twice daily for 5 days. 01/28/21 02/02/21 Yes Davonda Ausley,  02/04/21, PA-C  albuterol (VENTOLIN HFA) 108 (90 Base) MCG/ACT inhaler Inhale 2 puffs into the lungs every 6 (six) hours as needed for wheezing or shortness of breath. 12/25/18   Bronwen Pendergraft, 12/27/18, PA-C  tamsulosin (FLOMAX) 0.4 MG CAPS capsule Take 1 capsule (0.4 mg total) by mouth daily. 10/27/14   10/29/14, MD    Allergies Patient has no known allergies.  No family history on file.  Social History Social History   Tobacco Use   Smoking status: Never   Smokeless tobacco: Never  Substance Use Topics   Alcohol use: No    Review of Systems  Constitutional: Negative for fever. Eyes: Negative for visual changes. ENT: Negative for sore throat. Cardiovascular: Negative for chest pain. Respiratory: Negative for shortness of breath.  Reports cough and congestion as above Gastrointestinal: Negative for abdominal pain, vomiting and diarrhea. Genitourinary: Negative for dysuria. Musculoskeletal: Positive for back pain. Skin: Negative for rash. Neurological: Negative for headaches, focal weakness or numbness. ____________________________________________  PHYSICAL EXAM:  VITAL SIGNS: ED Triage Vitals  Enc Vitals Group     BP 01/28/21 1457 134/86     Pulse Rate 01/28/21 1457 100     Resp 01/28/21 1457 17     Temp 01/28/21 1457 99.5 F (37.5 C)     Temp Source 01/28/21 1457 Oral     SpO2 01/28/21 1457 97 %     Weight 01/28/21 1500 244 lb 11.2 oz (111 kg)  Height 01/28/21 1500 5\' 10"  (1.778 m)     Head Circumference --      Peak Flow --      Pain Score 01/28/21 1500 7     Pain Loc --      Pain Edu? --      Excl. in GC? --     Constitutional: Alert and oriented. Well appearing and in no distress. Head: Normocephalic and atraumatic. Eyes: Conjunctivae are normal. Normal extraocular movements Cardiovascular: Normal rate, regular rhythm. Normal distal pulses. Respiratory: Normal respiratory effort. No wheezes/rales/rhonchi. Gastrointestinal: Soft and  nontender. No distention.  No CVA tenderness elicited. Musculoskeletal: Normal spinal alignment without midline tenderness, spasm, deformity, or step-off.  Mildly tender to palpation to the left SI joint region.  Patient able demonstrate normal hip flexion range on exam.  Nontender with normal range of motion in all extremities.  Neurologic: Cranial nerves II through XII grossly intact.  Normal LE DTRs bilaterally.  Negative seated straight leg raise.  Normal gait without ataxia. Normal speech and language. No gross focal neurologic deficits are appreciated. Skin:  Skin is warm, dry and intact. No rash noted. Psychiatric: Mood and affect are normal. Patient exhibits appropriate insight and judgment. ____________________________________________    {LABS (pertinent positives/negatives) Labs Reviewed  RESP PANEL BY RT-PCR (FLU A&B, COVID) ARPGX2 - Abnormal; Notable for the following components:      Result Value   SARS Coronavirus 2 by RT PCR POSITIVE (*)    All other components within normal limits  ____________________________________________  {EKG  ____________________________________________   RADIOLOGY Official radiology report(s): No results found. ____________________________________________  PROCEDURES   Procedures ____________________________________________   INITIAL IMPRESSION / ASSESSMENT AND PLAN / ED COURSE  As part of my medical decision making, I reviewed the following data within the electronic MEDICAL RECORD NUMBER Labs reviewed as noted, Old chart reviewed, Radiograph reviewed as noted, and Notes from prior ED visits    DDX: lumbar strain, viral URI, renal calculi  Patient ED evaluation of multiple complaints including low back pain with left lower extremity referral, and some mild generalized cough and congestion.  Patient is evaluated for his complaints in ED, found to have a viral panel screen positive for COVID.  Patient with a history of of arthritis and  previously documented degenerative disc disease and facet arthropathy on prior plain films of the lumbar spine, presents with an acute flare of his low back pain with referral.  Symptoms are consistent with a likely sciatic nerve irritation.  Patient is discharged with a prescription for Paxlovid as well as prescriptions for muscle relaxants and Lidoderm patches.  He was referred to Ortho for further evaluation management of his ongoing low back pain and radicular symptoms.  Work note is provided for 5 days as appropriate.  Return precautions have been reviewed.  Shontez Laksh Hinners was evaluated in Emergency Department on 01/28/2021 for the symptoms described in the history of present illness. He was evaluated in the context of the global COVID-19 pandemic, which necessitated consideration that the patient might be at risk for infection with the SARS-CoV-2 virus that causes COVID-19. Institutional protocols and algorithms that pertain to the evaluation of patients at risk for COVID-19 are in a state of rapid change based on information released by regulatory bodies including the CDC and federal and state organizations. These policies and algorithms were followed during the patient's care in the ED. ____________________________________________  FINAL CLINICAL IMPRESSION(S) / ED DIAGNOSES  Final diagnoses:  COVID-19  DDD (degenerative disc  disease), lumbosacral  Sciatic leg pain      Lissa Hoard, PA-C 01/28/21 1718    Sharman Cheek, MD 01/28/21 2326

## 2021-01-28 NOTE — ED Triage Notes (Signed)
Pt c/o cough with congestion for the past several days, pt also c/o left lower back pain radiating into the buttock and leg for the past 4 months

## 2021-01-28 NOTE — ED Provider Notes (Signed)
Emergency Medicine Provider Triage Evaluation Note  Kyle Reynolds , a 40 y.o. male  was evaluated in triage. Cough, congestion for the past several days also has left lower back pain radiating into the buttocks and left leg x 4 months..  Review of Systems  Positive: Cough, back pain Negative: Fever  Physical Exam  BP 134/86 (BP Location: Left Arm)    Pulse 100    Temp 99.5 F (37.5 C) (Oral)    Resp 17    Ht 5\' 10"  (1.778 m)    Wt 111 kg    SpO2 97%    BMI 35.11 kg/m  Gen:   Awake, no distress  = Resp:  Normal effort = MSK:   Moves extremities without difficulty  Other:    Medical Decision Making  Medically screening exam initiated at 3:20 PM.  Appropriate orders placed.  Kyle Reynolds was informed that the remainder of the evaluation will be completed by another provider, this initial triage assessment does not replace that evaluation, and the importance of remaining in the ED until their evaluation is complete.    Garlan Fair, FNP 01/28/21 1522    01/30/21, MD 01/28/21 1900

## 2021-01-28 NOTE — ED Notes (Signed)
Patient discharged to home per MD order. Patient in stable condition, and deemed medically cleared by ED provider for discharge. Discharge instructions reviewed with patient/family using "Teach Back"; verbalized understanding of medication education and administration, and information about follow-up care. Denies further concerns. ° °

## 2021-01-28 NOTE — Discharge Instructions (Addendum)
Your exam and previous XRs show changes consistent with arthritis and sciatica. Your viral tests confirm Covid. Take the anti-viral medicine as directed.   Su examen y las radiografas anteriores muestran cambios compatibles con artritis y Engineer, agricultural. Sus pruebas virales confirman Covid. Tome el medicamento antiviral segn las indicaciones.

## 2021-05-01 ENCOUNTER — Emergency Department
Admission: EM | Admit: 2021-05-01 | Discharge: 2021-05-01 | Disposition: A | Discharge status: Home / Self Care | Payer: Self-pay | Attending: Emergency Medicine | Admitting: Emergency Medicine

## 2021-05-01 ENCOUNTER — Emergency Department: Payer: Self-pay

## 2021-05-01 ENCOUNTER — Other Ambulatory Visit: Payer: Self-pay

## 2021-05-01 DIAGNOSIS — J209 Acute bronchitis, unspecified: Secondary | ICD-10-CM | POA: Insufficient documentation

## 2021-05-01 DIAGNOSIS — Z8616 Personal history of COVID-19: Secondary | ICD-10-CM | POA: Insufficient documentation

## 2021-05-01 MED ORDER — AZITHROMYCIN 250 MG PO TABS
ORAL_TABLET | ORAL | 0 refills | Status: AC
Start: 2021-05-01 — End: ?

## 2021-05-01 MED ORDER — BENZONATATE 100 MG PO CAPS: 100.0000 mg | ORAL_CAPSULE | Freq: Three times a day (TID) | ORAL | 0 refills | Status: AC | PRN

## 2021-05-01 NOTE — ED Provider Notes (Signed)
? ?[facility] ?Provider Note ? ? ? Event Date/Time  ? First MD Initiated Contact with Patient 05/01/21 [phone]   ?  (approximate) ? ? ?History  ? ?Cough ? ? ?HPI ? ?Thong Omauri Boeve is a 41 y.o. male with history of COVID in December 2022 presents with cough and congestion for 8 to 10 days.  Patient states that the phlegm has been very thick and sticky, no known fever.  No chest pain/shortness of breath.  States his ears also feel congested.  Upper back area burns with a deep breath. ? ?  ? ? ?Physical Exam  ? ?Triage Vital Signs: ?ED Triage Vitals  ?Enc Vitals Group  ?   BP 05/01/21 0931 125/82  ?   Pulse Rate 05/01/21 0931 89  ?   Resp 05/01/21 0931 16  ?   Temp 05/01/21 0931 98.3 ?F (36.8 ?C)  ?   Temp Source 05/01/21 0931 Oral  ?   SpO2 05/01/21 0931 98 %  ?   Weight 05/01/21 0932 240 lb (108.9 kg)  ?   Height 05/01/21 0932 5\' 7"  (1.702 m)  ?   Head Circumference --   ?   Peak Flow --   ?   Pain Score 05/01/21 0932 7  ?   Pain Loc --   ?   Pain Edu? --   ?   Excl. in GC? --   ? ? ?Most recent vital signs: ?Vitals:  ? 05/01/21 0931  ?BP: 125/82  ?Pulse: 89  ?Resp: 16  ?Temp: 98.3 ?F (36.8 ?C)  ?SpO2: 98%  ? ? ? ?General: Awake, no distress.   ?CV:  Good peripheral perfusion. regular rate and  rhythm ?Resp:  Normal effort. Lungs CTA ?Abd:  No distention.   ?Other:    ? ? ?ED Results / Procedures / Treatments  ? ?Labs ?(all labs ordered are listed, but only abnormal results are displayed) ?Labs Reviewed - No data to display ? ? ?EKG ? ? ? ? ?RADIOLOGY ?Chest x-ray ? ? ? ?PROCEDURES: ? ? ?Procedures ? ? ?MEDICATIONS ORDERED IN ED: ?Medications - No data to display ? ? ?IMPRESSION / MDM / ASSESSMENT AND PLAN / ED COURSE  ?I reviewed the triage vital signs and the nursing notes. ?             ?               ? ?Differential diagnosis includes, but is not limited to, acute bronchitis, acute URI, COVID-pneumonia, CAP ? ?Chest x-ray ordered ? ?Will not repeat the COVID test as patient's been  positive in the last 3 months. ? ?Chest x-ray was independently reviewed by me.  Not seen any opacities or infiltrates.  Read as normal by radiology ? ?I did discuss the x-ray results with the patient.  I will place him on a Z-Pak and Tessalon Perles for bronchitis.  He is to follow-up with his regular doctor if not improving in 3 days.  Return emergency department if worsening.  Patient is in agreement with treatment plan.  He was discharged stable condition and given a work note. ? ? ?  ? ? ?FINAL CLINICAL IMPRESSION(S) / ED DIAGNOSES  ? ?Final diagnoses:  ?Acute bronchitis, unspecified organism  ? ? ? ?Rx / DC Orders  ? ?ED Discharge Orders   ? ?      Ordered  ?  azithromycin (ZITHROMAX Z-PAK) 250 MG tablet       ? 05/01/21  1156  ?  benzonatate (TESSALON PERLES) 100 MG capsule  3 times daily PRN       ? 05/01/21 1156  ? ?  ?  ? ?  ? ? ? ?Note:  This document was prepared using Dragon voice recognition software and may include unintentional dictation errors. ? ?  ?Faythe Ghee, PA-C ?05/01/21 1201 ? ?  ?Sharyn Creamer, MD ?05/01/21 1533 ? ?

## 2021-05-01 NOTE — ED Triage Notes (Signed)
Pt to ED for flulike symptoms. ? ?Pt states that 8 days ago, began having nasal and chest congestion, cough and "a lot of phlegm". Felt hot as if had a fever. ? ?Pt states was feeling better but started ?worse again since 2 days ago. Feels tired, congested, with wet cough, R earache, both ears stopped up.  Also complains of pulsating HA. ? ?Denies diarrhea at this time. Did have diarrhea at onset of illness. Upper back area "burns". ? ?Has been drinking tea and using cough syrup at home. ? ?Temp is 98.1, lungs clear. Ambulatory to room. ?

## 2022-01-17 ENCOUNTER — Emergency Department: Payer: Self-pay

## 2022-01-17 ENCOUNTER — Encounter: Payer: Self-pay | Admitting: Emergency Medicine

## 2022-01-17 ENCOUNTER — Other Ambulatory Visit: Payer: Self-pay

## 2022-01-17 ENCOUNTER — Emergency Department
Admission: EM | Admit: 2022-01-17 | Discharge: 2022-01-17 | Disposition: A | Discharge status: Home / Self Care | Payer: Self-pay | Attending: Emergency Medicine | Admitting: Emergency Medicine

## 2022-01-17 DIAGNOSIS — J069 Acute upper respiratory infection, unspecified: Secondary | ICD-10-CM | POA: Insufficient documentation

## 2022-01-17 DIAGNOSIS — Z20822 Contact with and (suspected) exposure to covid-19: Secondary | ICD-10-CM | POA: Insufficient documentation

## 2022-01-17 DIAGNOSIS — R051 Acute cough: Secondary | ICD-10-CM

## 2022-01-17 LAB — RESP PANEL BY RT-PCR (RSV, FLU A&B, COVID)  RVPGX2
Influenza A by PCR: NEGATIVE
Influenza B by PCR: NEGATIVE
Resp Syncytial Virus by PCR: NEGATIVE
SARS Coronavirus 2 by RT PCR: NEGATIVE

## 2022-01-17 LAB — GROUP A STREP BY PCR: Group A Strep by PCR: NOT DETECTED

## 2022-01-17 NOTE — ED Triage Notes (Signed)
Pt comes with c/o cough, congestion, sore throat and upper back pain. Pt states this all started 3 days ago.

## 2022-01-17 NOTE — Discharge Instructions (Addendum)
Please return for any new, worsening, or change in symptoms other concerns.  Your swabs and x-ray were normal today.

## 2022-01-17 NOTE — ED Provider Notes (Signed)
[facility] Provider Note    Event Date/Time   First MD Initiated Contact with Patient 01/17/22 0957     (approximate)   History   Cough   HPI  Kyle Reynolds is a 41 y.o. male with no reported past medical history presents today for evaluation of dry cough and stuffy nose for the past 2 to 3 days.  Patient reports that he has been using Flonase which temporarily improved his nasal congestion but then his nasal congestion returns.  He denies fevers or chills.  Denies trouble breathing except through his nose.  No chest pain, abdominal pain, nausea, vomiting, diarrhea.  No recent travel.  No sick contacts.  He reports that his cough is nonproductive.  No hemoptysis.  There are no problems to display for this patient.         Physical Exam   Triage Vital Signs: ED Triage Vitals  Enc Vitals Group     BP 01/17/22 0912 (!) 141/79     Pulse Rate 01/17/22 0912 79     Resp 01/17/22 0912 16     Temp 01/17/22 0912 98.6 F (37 C)     Temp src --      SpO2 01/17/22 0912 98 %     Weight 01/17/22 0913 220 lb (99.8 kg)     Height 01/17/22 0913 5\' 10"  (1.778 m)     Head Circumference --      Peak Flow --      Pain Score 01/17/22 0905 4     Pain Loc --      Pain Edu? --      Excl. in GC? --     Most recent vital signs: Vitals:   01/17/22 0912  BP: (!) 141/79  Pulse: 79  Resp: 16  Temp: 98.6 F (37 C)  SpO2: 98%    Physical Exam Vitals and nursing note reviewed.  Constitutional:      General: Awake and alert. No acute distress.    Appearance: Normal appearance. The patient is normal weight.  HENT:     Head: Normocephalic and atraumatic.     Mouth: Mucous membranes are moist.  Nasal congestion present Eyes:     General: PERRL. Normal EOMs        Right eye: No discharge.        Left eye: No discharge.     Conjunctiva/sclera: Conjunctivae normal.  Cardiovascular:     Rate and Rhythm: Normal rate and regular rhythm.     Pulses:  Normal pulses.     Heart sounds: Normal heart sounds Pulmonary:     Effort: Pulmonary effort is normal. No respiratory distress.     Breath sounds: Normal breath sounds.  Speaking easily in complete sentences Abdominal:     Abdomen is soft. There is no abdominal tenderness. No rebound or guarding. No distention. Musculoskeletal:        General: No swelling. Normal range of motion.     Cervical back: Normal range of motion and neck supple.  No rigidity Skin:    General: Skin is warm and dry.     Capillary Refill: Capillary refill takes less than 2 seconds.     Findings: No rash.  Neurological:     Mental Status: The patient is awake and alert.      ED Results / Procedures / Treatments   Labs (all labs ordered are listed, but only abnormal results are displayed) Labs Reviewed  RESP PANEL BY  RT-PCR (RSV, FLU A&B, COVID)  RVPGX2  GROUP A STREP BY PCR     EKG     RADIOLOGY I independently reviewed and interpreted imaging and agree with radiologists findings.     PROCEDURES:  Critical Care performed:   Procedures   MEDICATIONS ORDERED IN ED: Medications - No data to display   IMPRESSION / MDM / ASSESSMENT AND PLAN / ED COURSE  I reviewed the triage vital signs and the nursing notes.   Differential diagnosis includes, but is not limited to, Differential diagnosis includes, but is not limited to, URI, COVID, influenza, RSV, bronchitis, pneumonia.  Patient is awake and alert, hemodynamically stable and afebrile.  He has a normal oxygen saturation of 99% on room air.  He demonstrates no acute distress.  Recent swabs and chest x-ray are normal.  Discussed repeating these but he declined.  Patient has no tachycardia or hypoxia or exogenous hormone use or recent travel or periods of immobilization to suggest PE.  Symptoms of nasal congestion and dry cough are suggestive of URI.  We discussed return precautions and outpatient follow-up.  Patient understands and agrees with  plan.  Discharged in stable condition.  History, physical exam, recommendations discussed with the use of a Spanish interpreter.  Patient's presentation is most consistent with acute complicated illness / injury requiring diagnostic workup.      FINAL CLINICAL IMPRESSION(S) / ED DIAGNOSES   Final diagnoses:  Upper respiratory tract infection, unspecified type  Acute cough     Rx / DC Orders   ED Discharge Orders     None        Note:  This document was prepared using Dragon voice recognition software and may include unintentional dictation errors.   Jackelyn Hoehn, PA-C 01/17/22 1250    Georga Hacking, MD 01/17/22 [phone]

## 2022-03-08 ENCOUNTER — Emergency Department: Payer: Self-pay

## 2022-03-08 ENCOUNTER — Emergency Department
Admission: EM | Admit: 2022-03-08 | Discharge: 2022-03-08 | Disposition: A | Discharge status: Home / Self Care | Payer: Self-pay | Attending: Emergency Medicine | Admitting: Emergency Medicine

## 2022-03-08 DIAGNOSIS — R1032 Left lower quadrant pain: Secondary | ICD-10-CM | POA: Insufficient documentation

## 2022-03-08 DIAGNOSIS — R1013 Epigastric pain: Secondary | ICD-10-CM | POA: Insufficient documentation

## 2022-03-08 LAB — URINALYSIS, ROUTINE W REFLEX MICROSCOPIC
Bilirubin Urine: NEGATIVE
Glucose, UA: NEGATIVE mg/dL
Hgb urine dipstick: NEGATIVE
Ketones, ur: NEGATIVE mg/dL
Leukocytes,Ua: NEGATIVE
Nitrite: NEGATIVE
Protein, ur: NEGATIVE mg/dL
Specific Gravity, Urine: 1.005 (ref 1.005–1.030)
pH: 7 (ref 5.0–8.0)

## 2022-03-08 LAB — LIPASE, BLOOD: Lipase: 30 U/L (ref 11–51)

## 2022-03-08 LAB — CBC
HCT: 46.8 % (ref 39.0–52.0)
Hemoglobin: 15.6 g/dL (ref 13.0–17.0)
MCH: 28.6 pg (ref 26.0–34.0)
MCHC: 33.3 g/dL (ref 30.0–36.0)
MCV: 85.9 fL (ref 80.0–100.0)
Platelets: 360 10*3/uL (ref 150–400)
RBC: 5.45 MIL/uL (ref 4.22–5.81)
RDW: 12.2 % (ref 11.5–15.5)
WBC: 7.8 10*3/uL (ref 4.0–10.5)
nRBC: 0 % (ref 0.0–0.2)

## 2022-03-08 LAB — COMPREHENSIVE METABOLIC PANEL
ALT: 20 U/L (ref 0–44)
AST: 22 U/L (ref 15–41)
Albumin: 4.7 g/dL (ref 3.5–5.0)
Alkaline Phosphatase: 67 U/L (ref 38–126)
Anion gap: 7 (ref 5–15)
BUN: 17 mg/dL (ref 6–20)
CO2: 27 mmol/L (ref 22–32)
Calcium: 9.2 mg/dL (ref 8.9–10.3)
Chloride: 105 mmol/L (ref 98–111)
Creatinine, Ser: 1.04 mg/dL (ref 0.61–1.24)
GFR, Estimated: >60 mL/min (ref >60)
Glucose, Bld: 103 mg/dL — ABNORMAL HIGH (ref 70–99)
Potassium: 4.2 mmol/L (ref 3.5–5.1)
Sodium: 139 mmol/L (ref 135–145)
Total Bilirubin: 1.1 mg/dL (ref 0.3–1.2)
Total Protein: 8 g/dL (ref 6.5–8.1)

## 2022-03-08 MED ORDER — ALUM & MAG HYDROXIDE-SIMETH 200-200-20 MG/5ML PO SUSP
30.0000 mL | Freq: Once | ORAL | Status: AC
  Administered 2022-03-08: 30 mL via ORAL
  Filled 2022-03-08: qty 30

## 2022-03-08 MED ORDER — LIDOCAINE VISCOUS HCL 2 % MT SOLN
15.0000 mL | Freq: Once | OROMUCOSAL | Status: AC
  Administered 2022-03-08: 15 mL via ORAL
  Filled 2022-03-08: qty 15

## 2022-03-08 MED ORDER — KETOROLAC TROMETHAMINE 30 MG/ML IJ SOLN
30.0000 mg | Freq: Once | INTRAMUSCULAR | Status: AC
  Administered 2022-03-08: 30 mg via INTRAMUSCULAR
  Filled 2022-03-08: qty 1

## 2022-03-08 NOTE — ED Notes (Signed)
Pt to CT for renal stone study.

## 2022-03-08 NOTE — ED Notes (Signed)
Pt to ED for complains of LLQ abdominal pain since Thursday, pain was intermittent and has become more constant since last night. Pain is sharp in nature, 5/10.

## 2022-03-08 NOTE — ED Triage Notes (Signed)
Pt presents to the ED via POV due to abdominal pain that started yesterday morning. Pt states he is having upper and lower abdominal pain. Pt denies NVD. Pt states he can eat and drink. Pt denies urinary symptoms. Pt A&Ox4

## 2022-03-08 NOTE — ED Provider Notes (Signed)
[facility] Provider Note    Event Date/Time   First MD Initiated Contact with Patient 03/08/22 1249     (approximate)   History  Abdominal pain   HPI  Kyle Reynolds is a 42 y.o. male who presents with complaints of abdominal pain, patient reports both epigastric pain as well as left lower quadrant pain,'s pain started 2 days ago, epigastric pain is coming gone.  Denies fevers or chills.  Normal stools.  History of a kidney stone in the past     Physical Exam   Triage Vital Signs: ED Triage Vitals  Enc Vitals Group     BP 03/08/22 1237 128/81     Pulse Rate 03/08/22 1237 79     Resp 03/08/22 1237 18     Temp 03/08/22 1237 98.5 F (36.9 C)     Temp Source 03/08/22 1237 Oral     SpO2 03/08/22 1237 100 %     Weight 03/08/22 1240 102.1 kg (225 lb)     Height 03/08/22 1240 1.778 m (5\' 10" )     Head Circumference --      Peak Flow --      Pain Score 03/08/22 1237 5     Pain Loc --      Pain Edu? --      Excl. in [place]? --     Most recent vital signs: Vitals:   03/08/22 1237 03/08/22 1522  BP: 128/81 133/76  Pulse: 79 72  Resp: 18 16  Temp: 98.5 F (36.9 C)   SpO2: 100% 100%     General: Awake, no distress.  CV:  Good peripheral perfusion.  Resp:  Normal effort.  Abd:  No distention.  Mild tenderness left lower quadrant, no epigastric tenderness palpation Other:     ED Results / Procedures / Treatments   Labs (all labs ordered are listed, but only abnormal results are displayed) Labs Reviewed  COMPREHENSIVE METABOLIC PANEL - Abnormal; Notable for the following components:      Result Value   Glucose, Bld 103 (*)    All other components within normal limits  URINALYSIS, ROUTINE W REFLEX MICROSCOPIC - Abnormal; Notable for the following components:   Color, Urine STRAW (*)    APPearance CLEAR (*)    All other components within normal limits  LIPASE, BLOOD  CBC     EKG     RADIOLOGY CT renal stone study  viewed interpret by me, no acute abnormality noted, pending radiology review    PROCEDURES:  Critical Care performed:   Procedures   MEDICATIONS ORDERED IN ED: Medications  alum & mag hydroxide-simeth (MAALOX/MYLANTA) 200-200-20 MG/5ML suspension 30 mL (30 mLs Oral Given 03/08/22 1335)    And  lidocaine (XYLOCAINE) 2 % viscous mouth solution 15 mL (15 mLs Oral Given 03/08/22 1335)  ketorolac (TORADOL) 30 MG/ML injection 30 mg (30 mg Intramuscular Given 03/08/22 1450)     IMPRESSION / MDM / ASSESSMENT AND PLAN / ED COURSE  I reviewed the triage vital signs and the nursing notes. Patient's presentation is most consistent with acute presentation with potential threat to life or bodily function.   Patient presents with abdominal pain as detailed above, differential includes ureterolithiasis, less likely UTI, diverticulitis, mild gastritis as well.  Treated with GI cocktail with improvement of epigastric discomfort, lab work reviewed and is overall reassuring, CT renal stone study obtained  Patient treated with IM Toradol  CT scan is reassuring, patient is feeling better after  Toradol, no indication for admission at this time, appropriate for discharge with outpatient follow-up, return precautions discussed     FINAL CLINICAL IMPRESSION(S) / ED DIAGNOSES   Final diagnoses:  Left lower quadrant abdominal pain     Rx / DC Orders   ED Discharge Orders     None        Note:  This document was prepared using Dragon voice recognition software and may include unintentional dictation errors.   Lavonia Drafts, MD 03/08/22 [phone]

## 2023-03-13 ENCOUNTER — Emergency Department
Admission: EM | Admit: 2023-03-13 | Discharge: 2023-03-13 | Disposition: A | Discharge status: Home / Self Care | Payer: Self-pay | Attending: Student in an Organized Health Care Education/Training Program | Admitting: Student in an Organized Health Care Education/Training Program

## 2023-03-13 ENCOUNTER — Other Ambulatory Visit: Payer: Self-pay

## 2023-03-13 DIAGNOSIS — J020 Streptococcal pharyngitis: Secondary | ICD-10-CM | POA: Insufficient documentation

## 2023-03-13 DIAGNOSIS — Z20822 Contact with and (suspected) exposure to covid-19: Secondary | ICD-10-CM | POA: Insufficient documentation

## 2023-03-13 LAB — RESP PANEL BY RT-PCR (RSV, FLU A&B, COVID)  RVPGX2
Influenza A by PCR: NEGATIVE
Influenza B by PCR: NEGATIVE
Resp Syncytial Virus by PCR: NEGATIVE
SARS Coronavirus 2 by RT PCR: NEGATIVE

## 2023-03-13 LAB — GROUP A STREP BY PCR: Group A Strep by PCR: DETECTED — AB

## 2023-03-13 MED ORDER — PENICILLIN G BENZATHINE 1200000 UNIT/2ML IM SUSY
1.2000 10*6.[IU] | PREFILLED_SYRINGE | Freq: Once | INTRAMUSCULAR | Status: AC
  Administered 2023-03-13: 1.2 10*6.[IU] via INTRAMUSCULAR
  Filled 2023-03-13: qty 2

## 2023-03-13 NOTE — ED Notes (Signed)
 See triage notes. Patient c/o right sided sore throat that is worse when he swallows.

## 2023-03-13 NOTE — ED Notes (Signed)
 Patient alert and oriented.  No complaints of pain at this time.  No complaints of headache, sob.  Family at the bedside.  Ambulates without assistance.  Medication given - patient educated.

## 2023-03-13 NOTE — ED Provider Triage Note (Signed)
 Emergency Medicine Provider Triage Evaluation Note  Kyle Reynolds , a 43 y.o. male  was evaluated in triage.  Pt complains of sore throat x 1 week worse with swallowing.   Review of Systems  Positive:  Negative:   Physical Exam  BP (!) 124/105 (BP Location: Left Arm)   Pulse (!) 103   Temp 98.3 F (36.8 C) (Oral)   Resp 17   SpO2 99%  Gen:   Awake, no distress   Resp:  Normal effort  MSK:   Moves extremities without difficulty  Other:  Oropharynx clear. No erythema or exudates.   Medical Decision Making  Medically screening exam initiated at 4:32 PM.  Appropriate orders placed.  Kyle Reynolds was informed that the remainder of the evaluation will be completed by another provider, this initial triage assessment does not replace that evaluation, and the importance of remaining in the ED until their evaluation is complete.     Margrette, Kainoah Bartosiewicz A, PA-C 03/13/23 [phone]

## 2023-03-13 NOTE — ED Triage Notes (Addendum)
 Pt c/o right sided sore throat that is worse w/ swallowing. Pt has been taking advil w/ some relief, the last time he took he states he felt a crack and pain got worse. Pt denies N/V/D, SHOB, cough, body aches- reports some congestion. Pt is AOX4, NAD noted. Respirations even and unlabored.

## 2023-03-13 NOTE — Discharge Instructions (Addendum)
 You have been treated for strep throat with a single dose of penicillin  given as an injection in the emergency department.  Continue to take OTC Tylenol  or Motrin for any pain or fevers.  You should change your toothbrush in 24 hours.  Le han tratado contra la faringitis estreptoccica con ignacia dosis nica de penicilina administrada en forma de inyeccin en el departamento de emergencias.  Contine tomando Tylenol  o Motrin de venta libre para cualquier dolor o fiebre.  Debes cambiar tu cepillo de dientes en 24 horas.

## 2023-03-13 NOTE — ED Provider Notes (Signed)
 [facility] Emergency Department Provider Note     Event Date/Time   First MD Initiated Contact with Patient 03/13/23 1817     (approximate)   History   Sore Throat   HPI  History limited by Spanish language.  Interpreter Sisto V.) present for interview and exam.  Kyle Reynolds is a 43 y.o. male presents sore throat that is aggravated by swallowing.  He has been taking Advil with some limited benefit.  He presents to the ED denying any nausea, vomiting, chest pain or shortness of breath.  He does endorse his young son gets recurrent strep infections, he is concerned he may have been infected from last week.  Physical Exam   Triage Vital Signs: ED Triage Vitals  Encounter Vitals Group     BP 03/13/23 1630 (!) 124/105     Systolic BP Percentile --      Diastolic BP Percentile --      Pulse Rate 03/13/23 1630 (!) 103     Resp 03/13/23 1630 17     Temp 03/13/23 1630 98.3 F (36.8 C)     Temp Source 03/13/23 1630 Oral     SpO2 03/13/23 1630 99 %     Weight --      Height --      Head Circumference --      Peak Flow --      Pain Score 03/13/23 1631 5     Pain Loc --      Pain Education --      Exclude from Growth Chart --     Most recent vital signs: Vitals:   03/13/23 1630  BP: (!) 124/105  Pulse: (!) 103  Resp: 17  Temp: 98.3 F (36.8 C)  SpO2: 99%    General Awake, no distress. NAD HEENT NCAT. PERRL. EOMI. No rhinorrhea. Mucous membranes are moist.  Uvula is midline and tonsils are flat.  Oropharyngeal erythema is noted. CV:  Good peripheral perfusion. RRR RESP:  Normal effort. CTA ABD:  No distention.    ED Results / Procedures / Treatments   Labs (all labs ordered are listed, but only abnormal results are displayed) Labs Reviewed  GROUP A STREP BY PCR - Abnormal; Notable for the following components:      Result Value   Group A Strep by PCR DETECTED (*)    All other components within normal limits  RESP  PANEL BY RT-PCR (RSV, FLU A&B, COVID)  RVPGX2    EKG   RADIOLOGY  No results found.   PROCEDURES:  Critical Care performed: No  Procedures   MEDICATIONS ORDERED IN ED: Medications  penicillin  g benzathine (BICILLIN  LA) 1200000 UNIT/2ML injection 1.2 Million Units (has no administration in time range)    IMPRESSION / MDM / ASSESSMENT AND PLAN / ED COURSE  I reviewed the triage vital signs and the nursing notes.                              Differential diagnosis includes, but is not limited to, COVID, flu, RSV, viral URI, strep Pharyngitis  Patient's presentation is most consistent with acute complicated illness / injury requiring diagnostic workup.  Patient's diagnosis is consistent with strep pharyngitis.  Presents in no acute distress with sore throat.  Exam reveals erythematous oropharynx without evidence of PTA.  Strep PCR is confirming.  Patient will be discharged home with instructions to take OTC Tylenol   or Motrin as needed.  He is treated with a single dose of pen G given in the ED.  Patient is to follow up with his primary provider as discussed, as needed or otherwise directed. Patient is given ED precautions to return to the ED for any worsening or new symptoms.   FINAL CLINICAL IMPRESSION(S) / ED DIAGNOSES   Final diagnoses:  Strep pharyngitis     Rx / DC Orders   ED Discharge Orders     None        Note:  This document was prepared using Dragon voice recognition software and may include unintentional dictation errors.    Loyd Candida LULLA Aldona, PA-C 03/13/23 1914    Lang Dover, MD 03/13/23 RANDALL
